# Patient Record
Sex: Male | Born: 1990 | Race: Black or African American | Hispanic: No | Marital: Single | Smoking: Former smoker
Health system: Southern US, Community
[De-identification: ages and names within clinical notes are randomized; demographics above are authoritative.]

## PROBLEM LIST (undated history)

## (undated) DIAGNOSIS — K37 Unspecified appendicitis: Secondary | ICD-10-CM

## (undated) DIAGNOSIS — L731 Pseudofolliculitis barbae: Secondary | ICD-10-CM

## (undated) HISTORY — PX: APPENDECTOMY: SHX54

## (undated) HISTORY — DX: Pseudofolliculitis barbae: L73.1

---
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
## 2011-01-01 ENCOUNTER — Telehealth: Payer: Self-pay | Admitting: *Deleted

## 2011-01-01 NOTE — Telephone Encounter (Signed)
Mother has dropped off a form for summer camp that needs to be completed.  Form is on your desk. They are asking for copy of immunizations, I didn't see anything in epic or centricity regarding immunizations.

## 2011-01-02 NOTE — Telephone Encounter (Signed)
Awaiting Imm records from Mom.

## 2011-01-17 NOTE — Telephone Encounter (Signed)
I did not complete forms, we have no vaccine record -- I tried to communicate that the best way that we could. 3 calls were made to family.  Mother picked up incomplete forms -- Zachary Simpson is healthy in general.

## 2011-01-17 NOTE — Telephone Encounter (Signed)
Spoke with mother and she picked up uncompleted form but, patient has already left for camp. Patient picked these up last week

## 2013-01-06 ENCOUNTER — Encounter: Payer: Self-pay | Admitting: Family Medicine

## 2013-01-06 ENCOUNTER — Ambulatory Visit (INDEPENDENT_AMBULATORY_CARE_PROVIDER_SITE_OTHER): Payer: BC Managed Care – PPO | Admitting: Family Medicine

## 2013-01-06 VITALS — BP 110/60 | HR 56 | Temp 98.4°F | Ht 67.0 in | Wt 162.8 lb

## 2013-01-06 DIAGNOSIS — Z0000 Encounter for general adult medical examination without abnormal findings: Secondary | ICD-10-CM

## 2013-01-06 DIAGNOSIS — Z131 Encounter for screening for diabetes mellitus: Secondary | ICD-10-CM

## 2013-01-06 DIAGNOSIS — Z13220 Encounter for screening for lipoid disorders: Secondary | ICD-10-CM

## 2013-01-06 NOTE — Progress Notes (Signed)
Nature conservation officer at [facility] [address] [place] [address] Phone: [phone] Fax: [phone]  Date:  01/06/2013   Name:  Zachary Simpson   DOB:  1990-08-26   MRN:  [id number] Gender: male Age: 22 y.o.  Primary Physician:  Zachary Beat, MD  Evaluating MD: Zachary Beat, MD   Chief Complaint: Annual Exam   History of Present Illness:  Zachary Simpson is a 22 y.o. pleasant patient who presents with the following:  CPX:  Went to [facility] at [employer], then [place]. Wants to do online courses at [employer].   Preventative Health Maintenance Visit:  Health Maintenance Summary Reviewed and updated, unless pt declines services.  Tobacco History Reviewed. Alcohol: No concerns, no excessive use Exercise Habits: Some activity, rec at least 30 mins 5 times a week- weights and basketball STD concerns: no risk or activity to increase risk Drug Use: None Encouraged self-testicular check     No past medical history on file.  No past surgical history on file.  History   Social History  . Marital Status: Single    Spouse Name: N/A    Number of Children: N/A  . Years of Education: N/A   Occupational History  . other     [employer]   Social History Main Topics  . Smoking status: Former Games developer  . Smokeless tobacco: Never Used  . Alcohol Use: No  . Drug Use: No  . Sexually Active: No   Other Topics Concern  . Not on file   Social History Narrative   Went to [employer] at [employer], then [place]. Wants to do online courses at [employer].    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  No Known Allergies  No current outpatient prescriptions on file prior to visit.   No current facility-administered medications on file prior to visit.     Review of Systems:   General: Denies fever, chills, sweats. No significant weight loss. Eyes: Denies blurring,significant itching ENT: Denies earache, sore throat, and  hoarseness. Cardiovascular: Denies chest pains, palpitations, dyspnea on exertion Respiratory: Denies cough, dyspnea at rest,wheeezing Breast: no concerns about lumps GI: Denies nausea, vomiting, diarrhea, constipation, change in bowel habits, abdominal pain, melena, hematochezia GU: Denies penile discharge, ED, urinary flow / outflow problems. No STD concerns. Musculoskeletal: Denies back pain, joint pain Derm: Denies rash, itching Neuro: Denies  paresthesias, frequent falls, frequent headaches Psych: Denies depression, anxiety Endocrine: Denies cold intolerance, heat intolerance, polydipsia Heme: Denies enlarged lymph nodes Allergy: No hayfever  Physical Examination: BP 110/60  Pulse 56  Temp(Src) 98.4 F (36.9 C) (Oral)  Ht 5\' 7"  (1.702 m)  Wt 162 lb 12 oz (73.823 kg)  BMI 25.48 kg/m2  SpO2 98%  Ideal Body Weight: Weight in (lb) to have BMI = 25: 159.3   Wt Readings from Last 3 Encounters:  01/06/13 162 lb 12 oz (73.823 kg)  03/02/10 144 lb (65.318 kg) (46%*, Z = -0.09)  02/14/10 145 lb 8 oz (65.998 kg) (49%*, Z = -0.01)   * Growth percentiles are based on CDC 2-20 Years data.    GEN: well developed, well nourished, no acute distress Eyes: conjunctiva and lids normal, PERRLA, EOMI ENT: TM clear, nares clear, oral exam WNL Neck: supple, no lymphadenopathy, no thyromegaly, no JVD Pulm: clear to auscultation and percussion, respiratory effort normal CV: regular rate and rhythm, S1-S2, no murmur, rub or gallop, no  bruits, peripheral pulses normal and symmetric, no cyanosis, clubbing, edema or varicosities Chest: no scars, masses GI: soft, non-tender; no hepatosplenomegaly, masses; active bowel sounds all quadrants GU: no hernia, testicular mass, penile discharge, or prostate enlargement Lymph: no cervical, axillary or inguinal adenopathy MSK: gait normal, muscle tone and strength WNL, no joint swelling, effusions, discoloration, crepitus  SKIN: clear, good turgor, color  WNL, no rashes, lesions, or ulcerations Neuro: normal mental status, normal strength, sensation, and motion Psych: alert; oriented to person, place and time, normally interactive and not anxious or depressed in appearance.  Assessment and Plan: Health Maintenance Exam:  The patient's preventative maintenance and recommended screening tests for an annual wellness exam were reviewed in full today. Brought up to date unless services declined.  Counselled on the importance of diet, exercise, and its role in overall health and mortality. The patient's FH and SH was reviewed, including their home life, tobacco status, and drug and alcohol status.   Doing great. No concerns.  Check FLP, BMP.  Signed, Zachary Galea. Bellamy Judson, MD 01/06/2013 3:00 PM

## 2013-01-07 LAB — LIPID PANEL
Cholesterol: 136 mg/dL (ref 0–200)
LDL Cholesterol: 75 mg/dL (ref 0–99)
Triglycerides: 54 mg/dL (ref 0.0–149.0)

## 2013-01-07 LAB — BASIC METABOLIC PANEL
BUN: 11 mg/dL (ref 6–23)
Calcium: 9.3 mg/dL (ref 8.4–10.5)
Chloride: 107 mEq/L (ref 96–112)
Creatinine, Ser: 1.1 mg/dL (ref 0.4–1.5)
GFR: 105.92 mL/min (ref >60.00)

## 2013-01-08 ENCOUNTER — Encounter: Payer: Self-pay | Admitting: *Deleted

## 2016-08-05 ENCOUNTER — Emergency Department: Payer: Managed Care, Other (non HMO)

## 2016-08-05 ENCOUNTER — Inpatient Hospital Stay
Admission: EM | Admit: 2016-08-05 | Discharge: 2016-08-08 | DRG: 373 | Disposition: A | Discharge status: Home / Self Care | Payer: Managed Care, Other (non HMO) | Attending: General Surgery | Admitting: General Surgery

## 2016-08-05 DIAGNOSIS — K3580 Unspecified acute appendicitis: Secondary | ICD-10-CM

## 2016-08-05 DIAGNOSIS — K3532 Acute appendicitis with perforation and localized peritonitis, without abscess: Secondary | ICD-10-CM | POA: Diagnosis present

## 2016-08-05 DIAGNOSIS — K353 Acute appendicitis with localized peritonitis: Principal | ICD-10-CM | POA: Diagnosis present

## 2016-08-05 DIAGNOSIS — Z87891 Personal history of nicotine dependence: Secondary | ICD-10-CM

## 2016-08-05 DIAGNOSIS — R1031 Right lower quadrant pain: Secondary | ICD-10-CM | POA: Diagnosis not present

## 2016-08-05 HISTORY — DX: Unspecified appendicitis: K37

## 2016-08-05 LAB — COMPREHENSIVE METABOLIC PANEL
ALBUMIN: 4.3 g/dL (ref 3.5–5.0)
ALK PHOS: 82 U/L (ref 38–126)
ALT: 19 U/L (ref 17–63)
AST: 21 U/L (ref 15–41)
Anion gap: 8 (ref 5–15)
BILIRUBIN TOTAL: 0.8 mg/dL (ref 0.3–1.2)
BUN: 11 mg/dL (ref 6–20)
CO2: 29 mmol/L (ref 22–32)
Calcium: 9.2 mg/dL (ref 8.9–10.3)
Chloride: 99 mmol/L — ABNORMAL LOW (ref 101–111)
Creatinine, Ser: 1.18 mg/dL (ref 0.61–1.24)
GFR calc Af Amer: >60 mL/min (ref >60)
GFR calc non Af Amer: >60 mL/min (ref >60)
Glucose, Bld: 102 mg/dL — ABNORMAL HIGH (ref 65–99)
POTASSIUM: 4.2 mmol/L (ref 3.5–5.1)
SODIUM: 136 mmol/L (ref 135–145)
TOTAL PROTEIN: 8.2 g/dL — AB (ref 6.5–8.1)

## 2016-08-05 LAB — URINALYSIS, COMPLETE (UACMP) WITH MICROSCOPIC
BACTERIA UA: NONE SEEN
Bilirubin Urine: NEGATIVE
Glucose, UA: NEGATIVE mg/dL
HGB URINE DIPSTICK: NEGATIVE
Ketones, ur: NEGATIVE mg/dL
Leukocytes, UA: NEGATIVE
NITRITE: NEGATIVE
PROTEIN: NEGATIVE mg/dL
RBC / HPF: NONE SEEN RBC/hpf (ref 0–5)
SPECIFIC GRAVITY, URINE: 1.014 (ref 1.005–1.030)
SQUAMOUS EPITHELIAL / LPF: NONE SEEN
pH: 7 (ref 5.0–8.0)

## 2016-08-05 LAB — CBC
HEMATOCRIT: 45.4 % (ref 40.0–52.0)
HEMOGLOBIN: 15.3 g/dL (ref 13.0–18.0)
MCH: 26.6 pg (ref 26.0–34.0)
MCHC: 33.7 g/dL (ref 32.0–36.0)
MCV: 78.9 fL — ABNORMAL LOW (ref 80.0–100.0)
Platelets: 198 10*3/uL (ref 150–440)
RBC: 5.75 MIL/uL (ref 4.40–5.90)
RDW: 13.6 % (ref 11.5–14.5)
WBC: 12.6 10*3/uL — ABNORMAL HIGH (ref 3.8–10.6)

## 2016-08-05 LAB — LIPASE, BLOOD: Lipase: 12 U/L (ref 11–51)

## 2016-08-05 MED ORDER — SODIUM CHLORIDE 0.9 % IV SOLN
Freq: Once | INTRAVENOUS | Status: AC
Start: 2016-08-06 — End: 2016-08-06
  Administered 2016-08-06: 01:00:00 via INTRAVENOUS

## 2016-08-05 MED ORDER — IOPAMIDOL (ISOVUE-300) INJECTION 61%
100.0000 mL | Freq: Once | INTRAVENOUS | Status: AC | PRN
  Administered 2016-08-05: 100 mL via INTRAVENOUS

## 2016-08-05 MED ORDER — IOPAMIDOL (ISOVUE-300) INJECTION 61%
30.0000 mL | Freq: Once | INTRAVENOUS | Status: AC
  Administered 2016-08-05: 30 mL via ORAL

## 2016-08-05 MED ORDER — SODIUM CHLORIDE 0.9 % IV BOLUS (SEPSIS)
1000.0000 mL | Freq: Once | INTRAVENOUS | Status: AC
Start: 2016-08-05 — End: 2016-08-06
  Administered 2016-08-05: 1000 mL via INTRAVENOUS

## 2016-08-05 MED ORDER — PIPERACILLIN-TAZOBACTAM 3.375 G IVPB 30 MIN
3.3750 g | Freq: Once | INTRAVENOUS | Status: AC
  Administered 2016-08-06: 3.375 g via INTRAVENOUS
  Filled 2016-08-05: qty 50

## 2016-08-05 MED ORDER — MORPHINE SULFATE (PF) 4 MG/ML IV SOLN
4.0000 mg | Freq: Once | INTRAVENOUS | Status: AC
  Administered 2016-08-05: 4 mg via INTRAVENOUS
  Filled 2016-08-05: qty 1

## 2016-08-05 NOTE — ED Triage Notes (Signed)
PT c/o lower abdominal pain that began Saturday. Pt reports he was recently treated for appendicitis in December, with antibiotics.

## 2016-08-05 NOTE — ED Provider Notes (Signed)
[facility] Emergency Department Provider Note    ____________________________________________   I have reviewed the triage vital signs and the nursing notes.   HISTORY  Chief Complaint Abdominal Pain   History limited by: Not Limited   HPI Zachary Simpson is a 26 y.o. male who presents to the emergency department today because of concerns for abdominal pain. The patient states the pain started higher up in his stomach is now started migrating down towards his lower abdomen. Patient has had some accompanying nausea and vomiting. Is not had any diarrhea or bloody stool, in fact he has had some decreased bowel movements. Last bowel movement was 2 days ago. Patient states that 2 months ago he was treated for appendicitis. He states that time he had a ruptured appendicitis and he was treated with antibiotics. He has not noticed any fevers.   Past Medical History:  Diagnosis Date  . Appendicitis     Patient Active Problem List   Diagnosis Date Noted  . EXTERNAL HEMORRHOIDS 03/02/2010    History reviewed. No pertinent surgical history.  Prior to Admission medications   Not on File    Allergies Patient has no known allergies.  No family history on file.  Social History Social History  Substance Use Topics  . Smoking status: Former Games developer  . Smokeless tobacco: Never Used  . Alcohol use No    Review of Systems  Constitutional: Negative for fever. Cardiovascular: Negative for chest pain. Respiratory: Negative for shortness of breath. Gastrointestinal: Positive for abdominal pain. Genitourinary: Negative for dysuria. Musculoskeletal: Negative for back pain. Skin: Negative for rash. Neurological: Negative for headaches, focal weakness or numbness.  10-point ROS otherwise negative.  ____________________________________________   PHYSICAL EXAM:  VITAL SIGNS: ED Triage Vitals  Enc Vitals Group     BP 08/05/16 1837 (!) 154/91     Pulse  Rate 08/05/16 1837 76     Resp 08/05/16 1837 16     Temp 08/05/16 1837 98.4 F (36.9 C)     Temp Source 08/05/16 1837 Oral     SpO2 08/05/16 1837 100 %     Weight 08/05/16 1838 190 lb (86.2 kg)     Height 08/05/16 1838 5\' 7"  (1.702 m)     Head Circumference --      Peak Flow --      Pain Score 08/05/16 1838 8   Constitutional: Alert and oriented. Well appearing and in no distress. Eyes: Conjunctivae are normal. Normal extraocular movements. ENT   Head: Normocephalic and atraumatic.   Nose: No congestion/rhinnorhea.   Mouth/Throat: Mucous membranes are moist.   Neck: No stridor. Hematological/Lymphatic/Immunilogical: No cervical lymphadenopathy. Cardiovascular: Normal rate, regular rhythm.  No murmurs, rubs, or gallops.  Respiratory: Normal respiratory effort without tachypnea nor retractions. Breath sounds are clear and equal bilaterally. No wheezes/rales/rhonchi. Gastrointestinal: Soft. Tender to palpation in the right lower quadrant.  Genitourinary: Deferred Musculoskeletal: Normal range of motion in all extremities. No lower extremity edema. Neurologic:  Normal speech and language. No gross focal neurologic deficits are appreciated.  Skin:  Skin is warm, dry and intact. No rash noted. Psychiatric: Mood and affect are normal. Speech and behavior are normal. Patient exhibits appropriate insight and judgment.  ____________________________________________    LABS (pertinent positives/negatives)  Labs Reviewed  COMPREHENSIVE METABOLIC PANEL - Abnormal; Notable for the following:       Result Value   Chloride 99 (*)    Glucose, Bld 102 (*)    Total Protein 8.2 (*)  All other components within normal limits  CBC - Abnormal; Notable for the following:    WBC 12.6 (*)    MCV 78.9 (*)    All other components within normal limits  URINALYSIS, COMPLETE (UACMP) WITH MICROSCOPIC - Abnormal; Notable for the following:    Color, Urine YELLOW (*)    APPearance CLEAR (*)     All other components within normal limits  LIPASE, BLOOD     ____________________________________________   EKG  None  ____________________________________________    RADIOLOGY  CT abd/pel pending  ___________________________________________   PROCEDURES  Procedures  ____________________________________________   INITIAL IMPRESSION / ASSESSMENT AND PLAN / ED COURSE  Pertinent labs & imaging results that were available during my care of the patient were reviewed by me and considered in my medical decision making (see chart for details).  Patient presented to the emergency department today because of concerns for abdominal pain. On exam patient is tender in the right lower quadrant. Patient in mild leukocytosis. Patient recently was treated with antibiotics for appendicitis. At this point I do have concern for recurrent appendicitis. Will plan on obtaining CT abdomen and pelvis.  ____________________________________________   FINAL CLINICAL IMPRESSION(S) / ED DIAGNOSES  Abdominal pain  Note: This dictation was prepared with Dragon dictation. Any transcriptional errors that result from this process are unintentional     Phineas SemenGraydon Roshini Fulwider, MD 08/05/16 2246

## 2016-08-05 NOTE — ED Notes (Signed)
Patient transported to CT via stretcher.

## 2016-08-06 DIAGNOSIS — K352 Acute appendicitis with generalized peritonitis: Secondary | ICD-10-CM | POA: Diagnosis not present

## 2016-08-06 DIAGNOSIS — K3532 Acute appendicitis with perforation and localized peritonitis, without abscess: Secondary | ICD-10-CM | POA: Diagnosis present

## 2016-08-06 DIAGNOSIS — Z87891 Personal history of nicotine dependence: Secondary | ICD-10-CM | POA: Diagnosis not present

## 2016-08-06 DIAGNOSIS — K353 Acute appendicitis with localized peritonitis: Secondary | ICD-10-CM | POA: Diagnosis present

## 2016-08-06 DIAGNOSIS — R1031 Right lower quadrant pain: Secondary | ICD-10-CM | POA: Diagnosis present

## 2016-08-06 LAB — CBC
HCT: 42.2 % (ref 40.0–52.0)
HEMOGLOBIN: 14.2 g/dL (ref 13.0–18.0)
MCH: 26.7 pg (ref 26.0–34.0)
MCHC: 33.8 g/dL (ref 32.0–36.0)
MCV: 79.1 fL — ABNORMAL LOW (ref 80.0–100.0)
PLATELETS: 192 10*3/uL (ref 150–440)
RBC: 5.34 MIL/uL (ref 4.40–5.90)
RDW: 13.7 % (ref 11.5–14.5)
WBC: 10 10*3/uL (ref 3.8–10.6)

## 2016-08-06 LAB — BASIC METABOLIC PANEL
ANION GAP: 4 — AB (ref 5–15)
BUN: 10 mg/dL (ref 6–20)
CHLORIDE: 106 mmol/L (ref 101–111)
CO2: 30 mmol/L (ref 22–32)
Calcium: 8.9 mg/dL (ref 8.9–10.3)
Creatinine, Ser: 1.16 mg/dL (ref 0.61–1.24)
Glucose, Bld: 96 mg/dL (ref 65–99)
POTASSIUM: 3.8 mmol/L (ref 3.5–5.1)
SODIUM: 140 mmol/L (ref 135–145)

## 2016-08-06 LAB — SURGICAL PCR SCREEN
MRSA, PCR: NEGATIVE
STAPHYLOCOCCUS AUREUS: NEGATIVE

## 2016-08-06 LAB — MAGNESIUM: MAGNESIUM: 1.8 mg/dL (ref 1.7–2.4)

## 2016-08-06 LAB — PHOSPHORUS: PHOSPHORUS: 3.8 mg/dL (ref 2.5–4.6)

## 2016-08-06 MED ORDER — HYDROMORPHONE HCL 1 MG/ML IJ SOLN
0.5000 mg | INTRAMUSCULAR | Status: DC | PRN
  Administered 2016-08-06: 0.5 mg via INTRAVENOUS
  Filled 2016-08-06 (×2): qty 0.5

## 2016-08-06 MED ORDER — HYDRALAZINE HCL 20 MG/ML IJ SOLN: 10.0000 mg | INTRAMUSCULAR | Status: DC | PRN

## 2016-08-06 MED ORDER — ONDANSETRON HCL 4 MG/2ML IJ SOLN
4.0000 mg | Freq: Four times a day (QID) | INTRAMUSCULAR | Status: DC | PRN
  Administered 2016-08-07: 4 mg via INTRAVENOUS
  Filled 2016-08-06 (×2): qty 2

## 2016-08-06 MED ORDER — DIPHENHYDRAMINE HCL 50 MG/ML IJ SOLN: 25.0000 mg | Freq: Four times a day (QID) | INTRAMUSCULAR | Status: DC | PRN

## 2016-08-06 MED ORDER — ENOXAPARIN SODIUM 40 MG/0.4ML ~~LOC~~ SOLN
40.0000 mg | SUBCUTANEOUS | Status: DC
  Administered 2016-08-06 – 2016-08-08 (×3): 40 mg via SUBCUTANEOUS
  Filled 2016-08-06 (×3): qty 0.4

## 2016-08-06 MED ORDER — SODIUM CHLORIDE 0.9 % IV SOLN
INTRAVENOUS | Status: DC
  Administered 2016-08-06 – 2016-08-07 (×5): via INTRAVENOUS

## 2016-08-06 MED ORDER — ONDANSETRON 4 MG PO TBDP: 4.0000 mg | ORAL_TABLET | Freq: Four times a day (QID) | ORAL | Status: DC | PRN

## 2016-08-06 MED ORDER — KETOROLAC TROMETHAMINE 30 MG/ML IJ SOLN
30.0000 mg | Freq: Four times a day (QID) | INTRAMUSCULAR | Status: DC
  Administered 2016-08-06 – 2016-08-08 (×7): 30 mg via INTRAVENOUS
  Filled 2016-08-06 (×7): qty 1

## 2016-08-06 MED ORDER — PANTOPRAZOLE SODIUM 40 MG IV SOLR
40.0000 mg | Freq: Every day | INTRAVENOUS | Status: DC
  Administered 2016-08-06 – 2016-08-07 (×3): 40 mg via INTRAVENOUS
  Filled 2016-08-06 (×3): qty 40

## 2016-08-06 MED ORDER — MORPHINE SULFATE (PF) 4 MG/ML IV SOLN
4.0000 mg | INTRAVENOUS | Status: DC | PRN
  Administered 2016-08-06 (×4): 4 mg via INTRAVENOUS
  Filled 2016-08-06 (×4): qty 1

## 2016-08-06 MED ORDER — DIPHENHYDRAMINE HCL 25 MG PO CAPS: 25.0000 mg | ORAL_CAPSULE | Freq: Four times a day (QID) | ORAL | Status: DC | PRN

## 2016-08-06 MED ORDER — PIPERACILLIN-TAZOBACTAM 3.375 G IVPB
3.3750 g | Freq: Three times a day (TID) | INTRAVENOUS | Status: DC
  Administered 2016-08-06 – 2016-08-07 (×4): 3.375 g via INTRAVENOUS
  Filled 2016-08-06 (×4): qty 50

## 2016-08-06 NOTE — ED Notes (Signed)
Surgeon at bedside.  

## 2016-08-06 NOTE — H&P (Signed)
Patient ID: Zachary Simpson, male   DOB: 10-20-1990, 26 y.o.   MRN: [id number]  CC: ABDOMINAL PAIN  HPI Zachary Simpson is a 26 y.o. male who presents to the ER for evaluation of abdominal pain. Patient reports that approximately 2-1/2 months ago he was [facility] for ruptured appendicitis with antibiotics alone. He reports that he recovered well from that and was at home visiting family when he had a recurrence of his abdominal pain. The pain started in the midepigastrium and then gradually moved back to the right lower quadrant like his prior appendicitis. Initially thought it was related to constipation and took multiple doses of MiraLAX and Colace over the weekend without any results. He uses suppository and had a bowel movement without any relief of his pain. He's had nausea but no vomiting. He denies any fevers or chills. He denies any chest pain, shortness of breath, anorexia. He was eating all day today and last ate immediately before coming to the emergency department. 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 He is due to report on the third. He states that an interval appendectomy was being arranged for had not been scheduled yet.  HPI  Past Medical History:  Diagnosis Date  . Appendicitis     History reviewed. No pertinent surgical history. Patient has had no prior surgeries.  Family history: Denies any known family history of cancer, diabetes, heart disease.  Social History Social History  Substance Use Topics  . Smoking status: Former Games developermoker  . Smokeless tobacco: Never Used  . Alcohol use No    No Known Allergies  Current Facility-Administered Medications  Medication Dose Route Frequency Provider Last Rate Last Dose  . 0.9 %  sodium chloride infusion   Intravenous Once Phineas SemenGraydon Goodman, MD       No current outpatient prescriptions on file.     Review of Systems A Multi-point review of systems was asked and was  negative except for the findings documented in the history of present illness  Physical Exam Blood pressure (!) 143/97, pulse 65, temperature 98.4 F (36.9 C), temperature source Oral, resp. rate 16, height 5\' 7"  (1.702 m), weight 86.2 kg (190 lb), SpO2 99 %. CONSTITUTIONAL: No acute distress. EYES: Pupils are equal, round, and reactive to light, Sclera are non-icteric. EARS, NOSE, MOUTH AND THROAT: The oropharynx is clear. The oral mucosa is pink and moist. Hearing is intact to voice. LYMPH NODES:  Lymph nodes in the neck are normal. RESPIRATORY:  Lungs are clear. There is normal respiratory effort, with equal breath sounds bilaterally, and without pathologic use of accessory muscles. CARDIOVASCULAR: Heart is regular without murmurs, gallops, or rubs. GI: The abdomen is soft, tender to palpation in the right lower quadrant at McBurney's point but with a negative Rovsing or psoas sign., and nondistended. There are no palpable masses. There is no hepatosplenomegaly. There are normal bowel sounds in all quadrants. GU: Rectal deferred.   MUSCULOSKELETAL: Normal muscle strength and tone. No cyanosis or edema.   SKIN: Turgor is good and there are no pathologic skin lesions or ulcers. NEUROLOGIC: Motor and sensation is grossly normal. Cranial nerves are grossly intact. PSYCH:  Oriented to person, place and time. Affect is normal.  Data Reviewed Images and labs reviewed. Labs show mild leukocytosis of 12.6 but are otherwise within normal limits. CT scan of the abdomen does show an abscess around his appendix in the right lower quadrant with a visualized fecalith.  There is no free air within the abdomen. I have personally reviewed the patient's imaging, laboratory findings and medical records.    Assessment    Ruptured appendicitis with abscess    Plan    26 year old male with a ruptured appendicitis with abscess. This appears to be a short interval recurrence from his recent ruptured  appendicitis in [facility] Discussed the diagnosis at length with the patient. Plan will be for admission, IV fluids, IV antibiotics. Possible attempted drainage of the abscess tomorrow versus treatment with just antibiotics. Patient states he wants to try treating with antibiotics if possible. 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 He is an active duty reservist. Plan for admission, nothing by mouth, IV fluids, IV antibiotics. Possible IR drainage versus operation should he fail to improve on IV antibiotics. All questions answered to the patient's satisfaction and he agrees with this plan.     Time spent with the patient was 50 minutes, with more than 50% of the time spent in face-to-face education, counseling and care coordination.     Ricarda Frame, MD FACS General Surgeon 08/06/2016, 12:48 AM

## 2016-08-06 NOTE — ED Notes (Signed)
Pt transported to room 207 

## 2016-08-06 NOTE — Clinical Social Work Note (Signed)
CSW consulted by nursing due to patient's mother having questions about what patient will need to provide to his superior officers to let them know he is in the hospital. CSW advised nursing to inform patient that he will need to contact his superior officers to notify he is in the hospital and to find out what information he needs to provide them. York SpanielMonica Lexington Krotz MSW,LCSW [phone]

## 2016-08-06 NOTE — Progress Notes (Signed)
08/06/2016  Subjective: No acute events overnight. Patient was admitted with a [facility] of [facility] and came in overnight with worsening abdominal pain. He had a mild leukocytosis of 12.6 which has improved this morning to 10 with IV antibiotics. Reports that his pain is slightly better and denies any nausea or vomiting.  Vital signs: Temp:  [97.8 F (36.6 C)-98.4 F (36.9 C)] 97.8 F (36.6 C) (01/02 0808) Pulse Rate:  [58-104] 58 (01/02 0808) Resp:  [16-19] 16 (01/02 0808) BP: (125-154)/(81-97) 130/82 (01/02 0808) SpO2:  [96 %-100 %] 100 % (01/02 0808) Weight:  [86.2 kg (190 lb)] 86.2 kg (190 lb) (01/01 1838)   Intake/Output: 01/01 0701 - 01/02 0700 In: 523.3 [I.V.:441; IV Piggyback:82.3] Out: 425 [Urine:425]    Physical Exam: Constitutional: No acute distress Abdomen: Soft, nondistended, with mild tenderness to palpation in the right lower quadrant. No peritoneal signs.  Labs:   Recent Labs  08/05/16 1839 08/06/16 0511  WBC 12.6* 10.0  HGB 15.3 14.2  HCT 45.4 42.2  PLT 198 192    Recent Labs  08/05/16 1839 08/06/16 0511  NA 136 140  K 4.2 3.8  CL 99* 106  CO2 29 30  GLUCOSE 102* 96  BUN 11 10  CREATININE 1.18 1.16  CALCIUM 9.2 8.9   No results for input(s): LABPROT, INR in the last 72 hours.  Imaging: Ct Abdomen Pelvis W Contrast  Result Date: 08/05/2016 CLINICAL DATA:  Upper abdominal pain now radiating to lower abdomen. No bowel movement for 2 days. Ruptured appendicitis 2 months ago. No fever. EXAM: CT ABDOMEN AND PELVIS WITH CONTRAST TECHNIQUE: Multidetector CT imaging of the abdomen and pelvis was performed using the standard protocol following bolus administration of intravenous contrast. CONTRAST:  100mL ISOVUE-300 IOPAMIDOL (ISOVUE-300) INJECTION 61% COMPARISON:  None. FINDINGS: LOWER CHEST: Lung bases are clear. Included heart size is normal. No pericardial effusion. HEPATOBILIARY: Liver and gallbladder are  normal. PANCREAS: Normal. SPLEEN: Normal. ADRENALS/URINARY TRACT: Kidneys are orthotopic, demonstrating symmetric enhancement. No nephrolithiasis, hydronephrosis or solid renal masses. The unopacified ureters are normal in course and caliber. Urinary bladder is partially distended and unremarkable. Normal adrenal glands. STOMACH/BOWEL: The stomach, small and large bowel are normal in course and caliber without inflammatory changes. Appendix is 13 mm in transaxial dimension with hyperemic wall thickening and, 4 mm appendicolith. Surrounding 3.2 x 2.2 cm fluid collection with rim enhancement and fat stranding. Moderate amount of retained large bowel stool. VASCULAR/LYMPHATIC: Aortoiliac vessels are normal in course and caliber. No lymphadenopathy by CT size criteria. REPRODUCTIVE: Normal. OTHER: Small amount of free fluid in the pelvis. No intraperitoneal free air. MUSCULOSKELETAL: Nonacute. IMPRESSION: Perforated appendicitis, 3.2 x 2.2 cm para appendiceal abscess. Moderate amount of retained large bowel stool. No bowel obstruction. Electronically Signed   By: Awilda Metroourtnay  Bloomer M.D.   On: 08/05/2016 23:49    Assessment/Plan: 26 year old male with rupture appendicitis with abscess from [facility] [place] now with worsening abdominal pain and leukocytosis which has improved now. Next  -We'll continue IV antibiotics today and add ice chips and sips of water to his diet. His pain improves he may be advanced to clear liquids later today or likely tomorrow. Maryclare Labrador-We'll get in touch with his basein [place] as he needs to report there tomorrow.   Howie IllJose Luis Maisie Hauser, MD [facility]

## 2016-08-06 NOTE — Care Management Note (Signed)
Case Management Note  Patient Details  Name: Zachary Simpson MRN: [id number] Date of Birth: 12/14/1990  Subjective/Objective:        Patient has active VA benefits per night secretary, and paperwork was faxed to them.Admitted here because [facility] had trouble determining benefits per staff.            Action/Plan:   Expected Discharge Date:  08/08/16               Expected Discharge Plan:     In-House Referral:     Discharge planning Services     Post Acute Care Choice:    Choice offered to:     DME Arranged:    DME Agency:     HH Arranged:    HH Agency:     Status of Service:     If discussed at [employer] Meetings, dates discussed:    Additional Comments:  Berna BueCheryl Lachlan Mckim, RN 08/06/2016, 7:48 AM

## 2016-08-07 LAB — CBC WITH DIFFERENTIAL/PLATELET
Basophils Absolute: 0 10*3/uL (ref 0–0.1)
Basophils Relative: 0 %
EOS ABS: 0.2 10*3/uL (ref 0–0.7)
Eosinophils Relative: 3 %
HEMATOCRIT: 41.8 % (ref 40.0–52.0)
HEMOGLOBIN: 14.3 g/dL (ref 13.0–18.0)
LYMPHS ABS: 1.5 10*3/uL (ref 1.0–3.6)
Lymphocytes Relative: 22 %
MCH: 27 pg (ref 26.0–34.0)
MCHC: 34.2 g/dL (ref 32.0–36.0)
MCV: 78.8 fL — AB (ref 80.0–100.0)
MONO ABS: 0.6 10*3/uL (ref 0.2–1.0)
Monocytes Relative: 9 %
NEUTROS PCT: 66 %
Neutro Abs: 4.7 10*3/uL (ref 1.4–6.5)
PLATELETS: 187 10*3/uL (ref 150–440)
RBC: 5.31 MIL/uL (ref 4.40–5.90)
RDW: 13.3 % (ref 11.5–14.5)
WBC: 7 10*3/uL (ref 3.8–10.6)

## 2016-08-07 LAB — BASIC METABOLIC PANEL
ANION GAP: 7 (ref 5–15)
BUN: 13 mg/dL (ref 6–20)
CALCIUM: 8.4 mg/dL — AB (ref 8.9–10.3)
CO2: 26 mmol/L (ref 22–32)
Chloride: 108 mmol/L (ref 101–111)
Creatinine, Ser: 1.3 mg/dL — ABNORMAL HIGH (ref 0.61–1.24)
GLUCOSE: 75 mg/dL (ref 65–99)
POTASSIUM: 4 mmol/L (ref 3.5–5.1)
Sodium: 141 mmol/L (ref 135–145)

## 2016-08-07 MED ORDER — AMOXICILLIN-POT CLAVULANATE 875-125 MG PO TABS
1.0000 | ORAL_TABLET | Freq: Two times a day (BID) | ORAL | Status: DC
  Administered 2016-08-07 – 2016-08-08 (×3): 1 via ORAL
  Filled 2016-08-07 (×3): qty 1

## 2016-08-07 MED ORDER — POLYETHYLENE GLYCOL 3350 17 G PO PACK
17.0000 g | PACK | Freq: Every day | ORAL | Status: DC
  Administered 2016-08-07 – 2016-08-08 (×2): 17 g via ORAL
  Filled 2016-08-07 (×2): qty 1

## 2016-08-07 NOTE — Care Management (Signed)
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  Patient was visiting family for the holidays.  I have spoke [place].  There is no documentation that they need.  [facility]. Per Jacki ConesLaurie the patient needs to speak directly with his superior to determine if there any paper work that they need faxed.  Should the patient need to be transferred to another facility, patient would need to transfer to a facility local to his base.  Patient should follow up with MD at his base after discharge.  Anticipate discharge tomorrow.  RNCM following

## 2016-08-07 NOTE — Progress Notes (Signed)
08/07/2016  Subjective: No acute events overnight. Patient reports this morning that his pain has improved. Denies any vomiting or nausea. No fevers or chills.  Vital signs: Temp:  [97.3 F (36.3 C)-98.3 F (36.8 C)] 97.5 F (36.4 C) (01/03 1017) Pulse Rate:  [58-72] 65 (01/03 1017) Resp:  [14-16] 14 (01/03 1017) BP: (110-128)/(49-74) 128/74 (01/03 1017) SpO2:  [97 %-100 %] 100 % (01/03 1017)   Intake/Output: 01/02 0701 - 01/03 0700 In: 2991.5 [I.V.:2907.5; IV Piggyback:84] Out: 2500 [Urine:2500] Last BM Date: 08/03/16  Physical Exam: Constitutional: No acute distress Abdomen:  Soft, nondistended, with minimal to none tenderness to palpation anymore.  Labs:   Recent Labs  08/06/16 0511 08/07/16 0430  WBC 10.0 7.0  HGB 14.2 14.3  HCT 42.2 41.8  PLT 192 187    Recent Labs  08/06/16 0511 08/07/16 0430  NA 140 141  K 3.8 4.0  CL 106 108  CO2 30 26  GLUCOSE 96 75  BUN 10 13  CREATININE 1.16 1.30*  CALCIUM 8.9 8.4*   No results for input(s): LABPROT, INR in the last 72 hours.  Imaging: No results found.  Assessment/Plan: 26 year old male with ruptured appendicitis with abscess from [facility] [place] now with worsening abdominal pain and leukocytosis which has improved now.  -White blood cell count is normalized at 7.0. Patient denies any fevers with much improved abdominal pain. We'll start the patient a clear liquid diet today. -Transition IV antibiotics to oral version. -Likely discharge tomorrow.   Howie IllJose Luis Lauris Serviss, MD [facility]

## 2016-08-08 LAB — CBC WITH DIFFERENTIAL/PLATELET
Basophils Absolute: 0 10*3/uL (ref 0–0.1)
Basophils Relative: 0 %
Eosinophils Absolute: 0.2 10*3/uL (ref 0–0.7)
Eosinophils Relative: 4 %
HEMATOCRIT: 41.2 % (ref 40.0–52.0)
Hemoglobin: 13.8 g/dL (ref 13.0–18.0)
LYMPHS ABS: 2.1 10*3/uL (ref 1.0–3.6)
Lymphocytes Relative: 36 %
MCH: 26.8 pg (ref 26.0–34.0)
MCHC: 33.5 g/dL (ref 32.0–36.0)
MCV: 80.1 fL (ref 80.0–100.0)
MONO ABS: 0.6 10*3/uL (ref 0.2–1.0)
Monocytes Relative: 10 %
NEUTROS ABS: 2.9 10*3/uL (ref 1.4–6.5)
Neutrophils Relative %: 50 %
PLATELETS: 198 10*3/uL (ref 150–440)
RBC: 5.14 MIL/uL (ref 4.40–5.90)
RDW: 13.4 % (ref 11.5–14.5)
WBC: 5.8 10*3/uL (ref 3.8–10.6)

## 2016-08-08 MED ORDER — AMOXICILLIN-POT CLAVULANATE 875-125 MG PO TABS: 1.0000 | ORAL_TABLET | Freq: Two times a day (BID) | ORAL | 0 refills | Status: AC

## 2016-08-08 MED ORDER — IBUPROFEN 600 MG PO TABS: 600.0000 mg | ORAL_TABLET | Freq: Three times a day (TID) | ORAL | 0 refills | Status: DC | PRN

## 2016-08-08 NOTE — Discharge Summary (Signed)
Patient ID: Zachary Simpson MRN: [id number] DOB/AGE: [birth year] 26 y.o.  Admit date: 08/05/2016 Discharge date: 08/08/2016   Discharge Diagnoses:  Active Problems:   Ruptured appendicitis   Procedures: None  Hospital Course: Patient was admitted on 1/1 with recurrent appendicitis. Patient had a prior episode of rupture [place] He was treated with antibiotics and was on his way over to [place] for his next station when he developed abdominal pain. Here there was a very small abscess on CT scan which was uncertain whether this was from prior episode or new. He was treated with IV antibiotics and his white blood cell count normalized. She was transition to oral antibiotics and his diet was slowly advanced. Prior to discharge she was tolerating a regular diet, had no fevers, his white blood cell count was normal, was passing flatus and voiding without issues. He was deemed ready for discharge.  Consults:  None  Disposition: Home, self-care  Discharge Instructions    Call MD for:  difficulty breathing, headache or visual disturbances    Complete by:  As directed    Call MD for:  persistant nausea and vomiting    Complete by:  As directed    Call MD for:  severe uncontrolled pain    Complete by:  As directed    Call MD for:  temperature >100.4    Complete by:  As directed    Diet - low sodium heart healthy    Complete by:  As directed    Discharge instructions    Complete by:  As directed    Continue antibiotics until completed course.   Driving Restrictions    Complete by:  As directed    Do not drive while taking narcotics for pain control   Increase activity slowly    Complete by:  As directed      Allergies as of 08/08/2016   No Known Allergies     Medication List    TAKE these medications   acetaminophen 325 MG tablet Commonly known as:  TYLENOL Take 650 mg by mouth every 6 (six) hours as needed.   amoxicillin-clavulanate  875-125 MG tablet Commonly known as:  AUGMENTIN Take 1 tablet by mouth every 12 (twelve) hours.   docusate sodium 100 MG capsule Commonly known as:  COLACE Take 100 mg by mouth 2 (two) times daily.   ibuprofen 600 MG tablet Commonly known as:  ADVIL,MOTRIN Take 1 tablet (600 mg total) by mouth every 8 (eight) hours as needed for fever, mild pain or moderate pain.   polyethylene glycol packet Commonly known as:  MIRALAX / GLYCOLAX Take 17 g by mouth daily.   ROXICODONE 5 MG immediate release tablet Generic drug:  oxyCODONE Take 5 mg by mouth every 4 (four) hours as needed for severe pain.      Follow-up Information    [facility] [place] Follow up.   Why:  Please follow up with your surgeon in [place] for interval appendectomy.

## 2016-08-08 NOTE — Progress Notes (Signed)
08/08/2016  Subjective: No acute events overnight. Patient tolerated regular diet well with no complications. Has been passing flatus. Denies any nausea or vomiting or fevers. Denies any further abdominal pain.  Vital signs: Temp:  [97.5 F (36.4 C)-98.4 F (36.9 C)] 97.9 F (36.6 C) (01/04 0516) Pulse Rate:  [56-132] 56 (01/04 0516) Resp:  [14-22] 17 (01/04 0516) BP: (109-128)/(60-79) 113/79 (01/04 0516) SpO2:  [97 %-100 %] 97 % (01/04 0516)   Intake/Output: 01/03 0701 - 01/04 0700 In: 2731.6 [P.O.:750; I.V.:1981.6] Out: 1550 [Urine:1550] Last BM Date: 08/03/16  Physical Exam: Constitutional: No acute distress Abdomen:  Soft, nondistended, nontender to palpation  Labs:   Recent Labs  08/07/16 0430 08/08/16 0542  WBC 7.0 5.8  HGB 14.3 13.8  HCT 41.8 41.2  PLT 187 198    Recent Labs  08/06/16 0511 08/07/16 0430  NA 140 141  K 3.8 4.0  CL 106 108  CO2 30 26  GLUCOSE 96 75  BUN 10 13  CREATININE 1.16 1.30*  CALCIUM 8.9 8.4*   No results for input(s): LABPROT, INR in the last 72 hours.  Imaging: No results found.  Assessment/Plan: [age] year old male with a history of [facility] [facility] [place] treated with antibiotics with worsening abdominal pain and leukocytosis now resolved.  -We'll discharge the patient to home today. Patient will complete a course of oral antibiotics at home.   Howie IllJose Luis Denese Mentink, MD [facility]

## 2016-08-08 NOTE — Plan of Care (Signed)
Problem: Pain Managment: Goal: General experience of comfort will improve Outcome: Progressing Pt is reporting further decrease in pain.

## 2016-08-08 NOTE — Progress Notes (Signed)
Pt A and O x 4. VSS. Pt tolerating diet well. No complaints of pain or nausea. IV removed intact, prescriptions given. Pt voiced understanding of discharge instructions with no further questions. Pt refused wheelchair and ambulated downstairs independently.

## 2017-01-27 ENCOUNTER — Telehealth: Payer: Self-pay | Admitting: Family Medicine

## 2017-01-27 NOTE — Telephone Encounter (Signed)
Pt would like to continue having you as a provider. He states he has been in the [employer] for the last 4 years and they took care of his medical needs.  Pt is wanting to see you and get a letter for work about shaving ( he states that it breaks his face out)   Please let me know if I can schedule him for an appointment with you.  Thanks

## 2017-01-27 NOTE — Telephone Encounter (Signed)
This is ok, I am happy to see him

## 2017-02-06 ENCOUNTER — Ambulatory Visit: Payer: Managed Care, Other (non HMO) | Admitting: Family Medicine

## 2017-02-19 ENCOUNTER — Ambulatory Visit (INDEPENDENT_AMBULATORY_CARE_PROVIDER_SITE_OTHER): Payer: 59 | Admitting: Family Medicine

## 2017-02-19 ENCOUNTER — Encounter: Payer: Self-pay | Admitting: Family Medicine

## 2017-02-19 VITALS — BP 100/70 | HR 64 | Temp 98.5°F | Ht 66.75 in | Wt 203.8 lb

## 2017-02-19 DIAGNOSIS — L731 Pseudofolliculitis barbae: Secondary | ICD-10-CM

## 2017-02-19 NOTE — Progress Notes (Signed)
Dr. Karleen Hampshire T. Yuleni Burich, MD, CAQ Sports Medicine Primary Care and Sports Medicine [address] [place], [address] Phone: [phone] Fax: [phone]  02/19/2017  Patient: Zachary Simpson, MRN: [id number], DOB: [birth year], 26 y.o.  Primary Physician:  Hannah Beat, MD   Chief Complaint  Patient presents with  . Re-Establish Care   Subjective:   Zachary Simpson is a 26 y.o. 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  Single.  No children.   He has been doing well, and is been healthy with the exception of an appendicitis earlier in the year. He is fully recovered from this now.  He also has recurrent pseudofolliculitis barbae. He has trouble with this when he has to shave every day, and per [employer] regulation, he is supposed to do this daily, along with his workplace. He would like to have an accommodation.  Past Medical History, Surgical History, Social History, Family History, Problem List, Medications, and Allergies have been reviewed and updated if relevant.  Patient Active Problem List   Diagnosis Date Noted  . Ruptured appendicitis 08/06/2016  . EXTERNAL HEMORRHOIDS 03/02/2010    Past Medical History:  Diagnosis Date  . Appendicitis     Past Surgical History:  Procedure Laterality Date  . APPENDECTOMY      Social History   Social History  . Marital status: Single    Spouse name: N/A  . Number of children: N/A  . Years of education: N/A   Occupational History  . other     [employer]   Social History Main Topics  . Smoking status: Former Games developer  . Smokeless tobacco: Never Used  . Alcohol use No  . Drug use: No  . Sexual activity: No   Other Topics Concern  . Not on file   Social History Narrative   Went to [employer] at [employer], then [place]. Wants to do online courses at [employer].    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 Hypertension Mother   . Stroke Maternal Grandfather     No Known Allergies  Medication list reviewed and updated in full in [facility].   GEN: No acute illnesses, no fevers, chills. GI: No n/v/d, eating normally Pulm: No SOB Interactive and getting along well at home.  Otherwise, ROS is as per the HPI.  Objective:   BP 100/70   Pulse 64   Temp 98.5 F (36.9 C) (Oral)   Ht 5' 6.75" (1.695 m)   Wt 203 lb 12 oz (92.4 kg)   BMI 32.15 kg/m   GEN: WDWN, NAD, Non-toxic, A & O x 3 HEENT: Atraumatic, Normocephalic. Neck supple. No masses, No LAD. Ears and Nose: No external deformity. CV: RRR, No M/G/R. No JVD. No thrill. No extra heart sounds. PULM: CTA B, no wheezes, crackles, rhonchi. No retractions. No resp. distress. No accessory muscle use. EXTR: No c/c/e NEURO Normal gait.  PSYCH: Normally interactive. Conversant. Not depressed or anxious appearing.  Calm demeanor.   Laboratory and Imaging Data:  Assessment and Plan:   Pseudofolliculitis barbae  Overall health is doing well.  His pseudofolliculitis Barbae is not going to go away, and this is going to be a lifelong issue for him. I wrote him a accommodation.  Follow-up: No Follow-up on file.  Medications Discontinued During This Encounter  Medication Reason  . polyethylene glycol (MIRALAX / GLYCOLAX) packet One time medication  . oxyCODONE (ROXICODONE) 5 MG immediate release tablet One time medication  . ibuprofen (ADVIL,MOTRIN) 600 MG tablet One time medication  . docusate sodium (COLACE) 100 MG capsule One time medication  . acetaminophen (TYLENOL) 325 MG tablet One time medication    Signed,  Bernardo Brayman T. Caleel Kiner, MD   Allergies as of 02/19/2017   No Known Allergies     Medication List    as of 02/19/2017 10:14 AM   You have not been prescribed any medications.

## 2017-04-10 ENCOUNTER — Encounter: Payer: Self-pay | Admitting: Internal Medicine

## 2017-04-10 ENCOUNTER — Ambulatory Visit (INDEPENDENT_AMBULATORY_CARE_PROVIDER_SITE_OTHER): Payer: 59 | Admitting: Internal Medicine

## 2017-04-10 VITALS — BP 104/80 | HR 75 | Temp 98.4°F | Wt 211.0 lb

## 2017-04-10 DIAGNOSIS — N342 Other urethritis: Secondary | ICD-10-CM | POA: Insufficient documentation

## 2017-04-10 MED ORDER — CEFTRIAXONE SODIUM 250 MG IJ SOLR
250.0000 mg | Freq: Once | INTRAMUSCULAR | Status: AC
  Administered 2017-04-10: 250 mg via INTRAMUSCULAR

## 2017-04-10 MED ORDER — AZITHROMYCIN 250 MG PO TABS: ORAL_TABLET | ORAL | 0 refills | Status: DC

## 2017-04-10 NOTE — Patient Instructions (Signed)
Urethritis, Adult Urethritis is an inflammation of the tube through which urine exits your bladder (urethra). What are the causes? Urethritis is often caused by an infection in your urethra. The infection can be viral, like herpes. The infection can also be bacterial, like gonorrhea. What increases the risk? Risk factors of urethritis include:  Having sex without using a condom.  Having multiple sexual partners.  Having poor hygiene.  What are the signs or symptoms? Symptoms of urethritis are less noticeable in women than in men. These symptoms include:  Burning feeling when you urinate (dysuria).  Discharge from your urethra.  Blood in your urine (hematuria).  Urinating more than usual.  How is this diagnosed? To confirm a diagnosis of urethritis, your health care provider will do the following:  Ask about your sexual history.  Perform a physical exam.  Have you provide a sample of your urine for lab testing.  Use a cotton swab to gently collect a sample from your urethra for lab testing.  How is this treated? It is important to treat urethritis. Depending on the cause, untreated urethritis may lead to serious genital infections and possibly infertility. Urethritis caused by a bacterial infection is treated with antibiotic medicine. All sexual partners must be treated. Follow these instructions at home:  Do not have sex until the test results are known and treatment is completed, even if your symptoms go away before you finish treatment.  If you were prescribed an antibiotic, finish it all even if you start to feel better. Contact a health care provider if:  Your symptoms are not improved in 3 days.  Your symptoms are getting worse.  You develop abdominal pain or pelvic pain (in women).  You develop joint pain.  You have a fever. Get help right away if:  You have severe pain in the belly, back, or side.  You have repeated vomiting. This information is not  intended to replace advice given to you by your health care provider. Make sure you discuss any questions you have with your health care provider. Document Released: 01/15/2001 Document Revised: 12/28/2015 Document Reviewed: 03/22/2013 Elsevier Interactive Patient Education  2017 Elsevier Inc.  

## 2017-04-10 NOTE — Progress Notes (Signed)
   Subjective:    Patient ID: Zachary Simpson, male    DOB: May 25, 1991, 26 y.o.   MRN: [id number]  HPI Here for concerns about STD  New partner ~8/25 or just after Several times with sex Noticed some pain with voiding 3-4 days ago. Related to irritation from coitus Still having burning dysuria every time Then noticed some bumps--- like blisters. Irritated and itchy without pain  No fever Feels okay otherwise  No current outpatient prescriptions on file prior to visit.   No current facility-administered medications on file prior to visit.     No Known Allergies  Past Medical History:  Diagnosis Date  . Appendicitis     Past Surgical History:  Procedure Laterality Date  . APPENDECTOMY      Family History  Problem Relation Age of Onset  . Hypertension Mother   . Stroke Maternal Grandfather     Social History   Social History  . Marital status: Single    Spouse name: N/A  . Number of children: N/A  . Years of education: N/A   Occupational History  . other     [employer]  . 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 Smoking status: Former Games developermoker  . Smokeless tobacco: Never Used  . Alcohol use No  . Drug use: No  . Sexual activity: No   Other Topics Concern  . Not on file   Social History Narrative   Went to [employer] at [employer] then [place] Wants to do online courses at [employer].    Will start 2 year internship at [employer] [place]   Not married   Active [employer] starting in 2016   Currently 2018 in [employer]   Working in security   Review of Systems  Usually would use condoms No GI symptoms     Objective:   Physical Exam  Constitutional: No distress.  Genitourinary:  Genitourinary Comments: Marked inflammation and tenderness of urethra --swab done Multiple 1-582mm papules (probably warts)          Assessment & Plan:

## 2017-04-10 NOTE — Addendum Note (Signed)
Addended by: Eual FinesBRIDGES, Dion Sibal P on: 04/10/2017 05:32 PM   Modules accepted: Orders

## 2017-04-10 NOTE — Addendum Note (Signed)
Addended by: Roena MaladyEVONTENNO, MELANIE Y on: 04/10/2017 05:25 PM   Modules accepted: Orders

## 2017-04-10 NOTE — Addendum Note (Signed)
Addended by: Gregery NaVALENCIA, Oluwanifemi Susman P on: 04/10/2017 05:21 PM   Modules accepted: Orders

## 2017-04-10 NOTE — Assessment & Plan Note (Signed)
Clearly STD Likely chlamydia since no discharge but will give rocephin and azithromycin Apparent genital warts--- doesn't look like herpes. Discussed that he will now have this, but not clearly with symptoms all the time Will check RPR and HIV also--discussed

## 2017-04-11 LAB — C. TRACHOMATIS/N. GONORRHOEAE RNA
C. TRACHOMATIS RNA, TMA: NOT DETECTED
N. gonorrhoeae RNA, TMA: NOT DETECTED

## 2017-04-11 LAB — RPR: RPR: NONREACTIVE

## 2017-04-11 LAB — HIV ANTIBODY (ROUTINE TESTING W REFLEX): HIV 1&2 Ab, 4th Generation: NONREACTIVE

## 2017-04-17 ENCOUNTER — Telehealth: Payer: Self-pay

## 2017-04-17 MED ORDER — METRONIDAZOLE 500 MG PO TABS: 500.0000 mg | ORAL_TABLET | Freq: Two times a day (BID) | ORAL | 0 refills | Status: AC

## 2017-04-17 NOTE — Telephone Encounter (Signed)
Pt left v/m; pt seen 04/10/17; pt has fishy smell in genitalia. Is not from sweating or musk scent. Pt wants to know if this is expected; how long will this take to go away or does pt need med or some type wash to use. [facility]

## 2017-04-17 NOTE — Telephone Encounter (Signed)
Since his partner had vaginosis, okay to Rx with metronidazole  bid x 7 days #14 x 0 Make sure he knows he should not drink alcohol during this treatment

## 2017-04-17 NOTE — Telephone Encounter (Signed)
Spoke to pt. He will start the med abx. I advised him not to drink alcohol and that the medication can turn his urine orange.

## 2017-05-12 IMAGING — CT CT ABD-PELV W/ CM
2 of 4 series · 16 of 46 positions shown, 18 images · IV contrast (APPLIED)
Comparison: None.

CLINICAL DATA: Upper abdominal pain now radiating to lower abdomen.
No bowel movement for 2 days. Ruptured appendicitis 2 months ago. No
fever.

EXAM:
CT ABDOMEN AND PELVIS WITH CONTRAST
TECHNIQUE: Multidetector CT imaging of the abdomen and pelvis was performed
using the standard protocol following bolus administration of
intravenous contrast.
CONTRAST:  100mL RYSKP9-GVV IOPAMIDOL (RYSKP9-GVV) INJECTION 61%

[Series 3: routine abd/pel with . · axial · 0.74mm/px · z∈[-846,-432]mm · 13 of 91 slices shown, 15 images]
[im 4/91  soft-tissue]
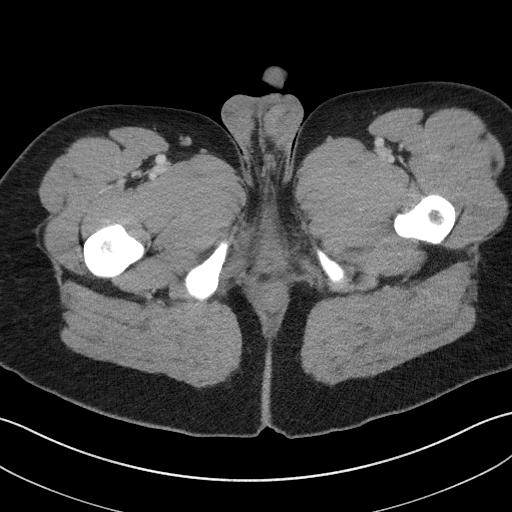
[im 4/91  bone]
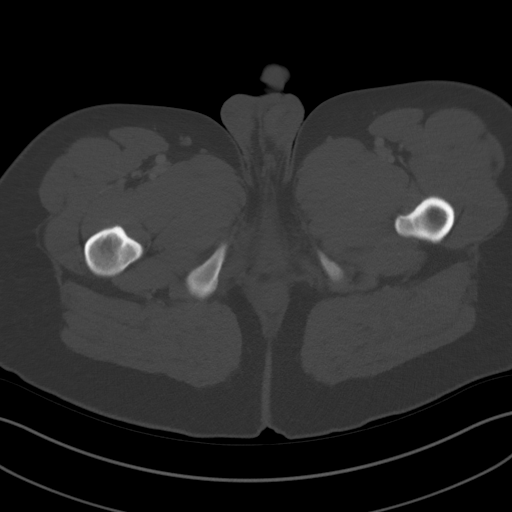
[im 11/91  soft-tissue]
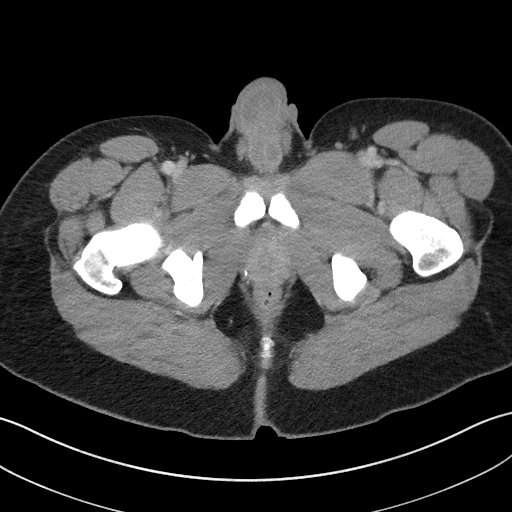
[im 19/91  soft-tissue]
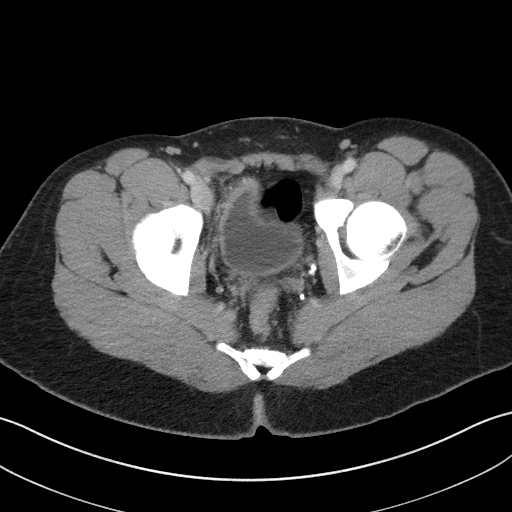
[im 26/91  soft-tissue]
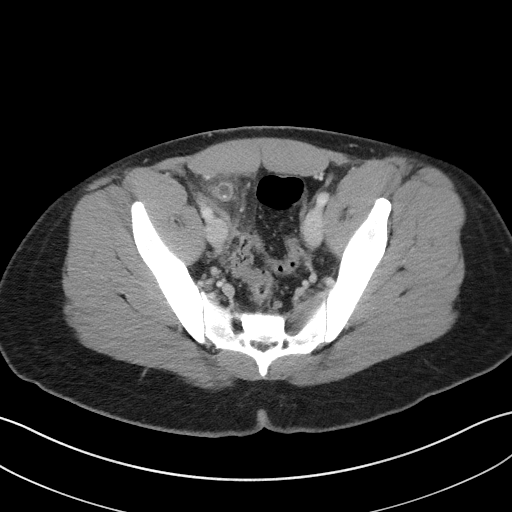
[im 33/91  soft-tissue]
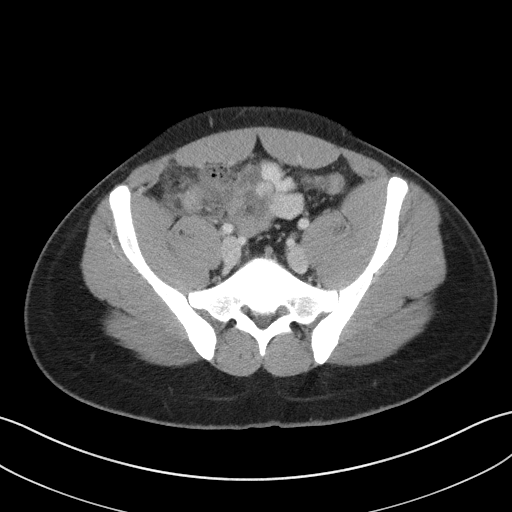
[im 40/91  soft-tissue]
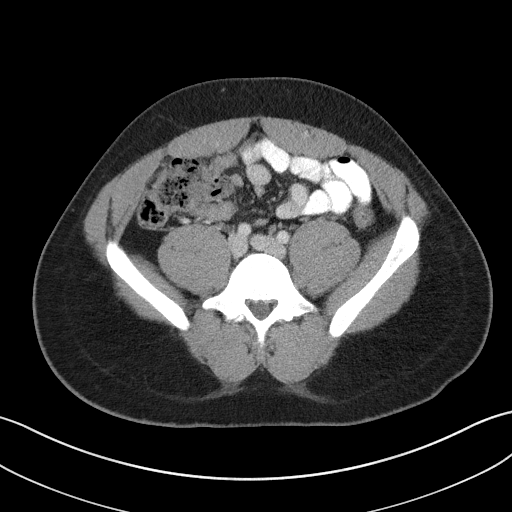
[im 47/91  soft-tissue]
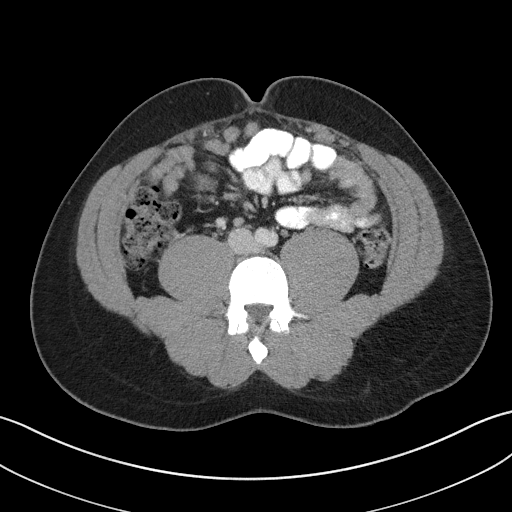
[im 51/91  soft-tissue]
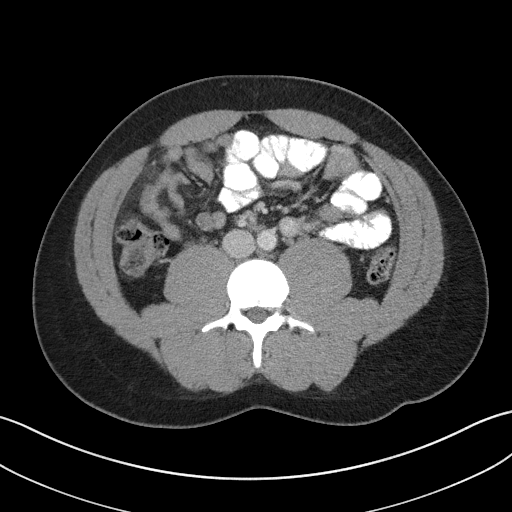
[im 58/91  soft-tissue]
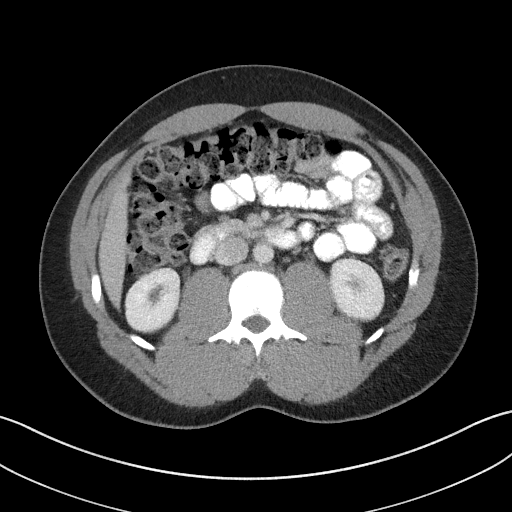
[im 58/91  bone]
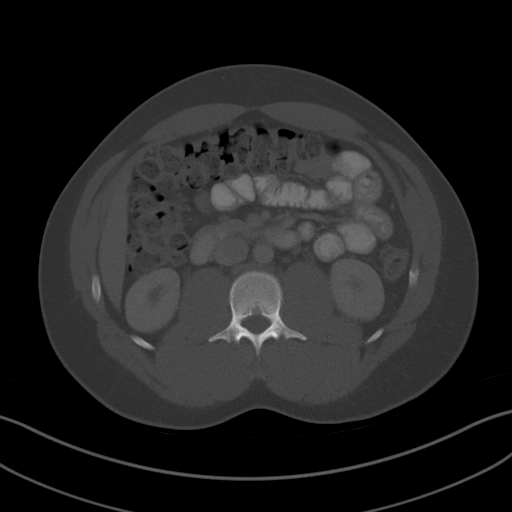
[im 65/91  soft-tissue]
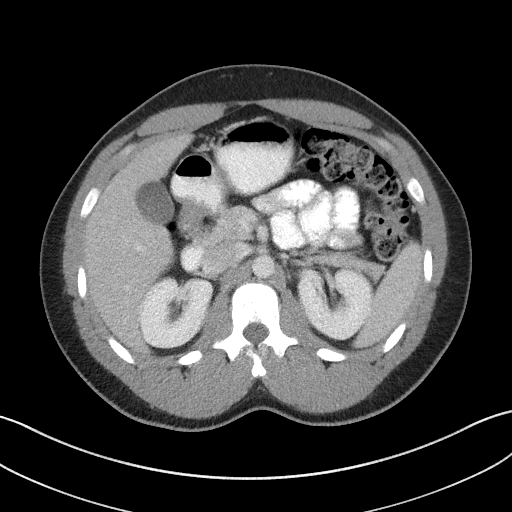
[im 73/91  soft-tissue]
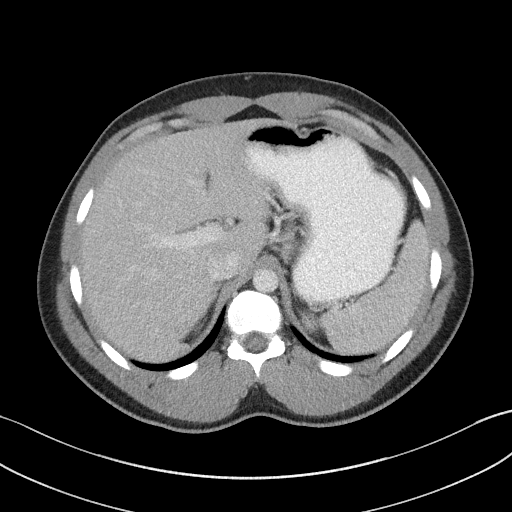
[im 80/91  soft-tissue]
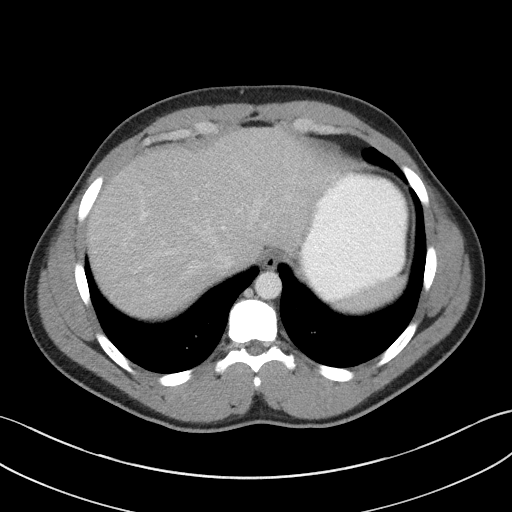
[im 87/91  soft-tissue]
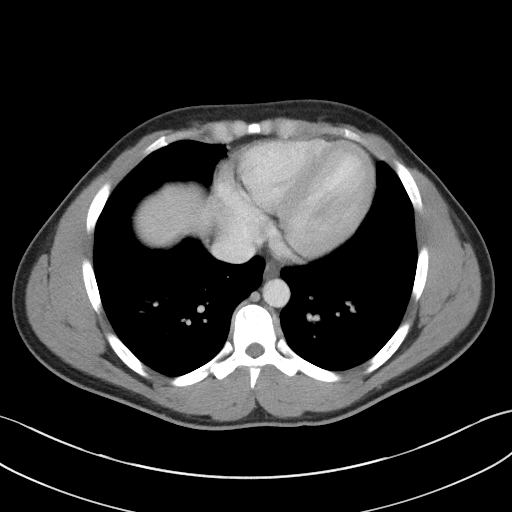

[Series 6: coronal st · coronal · 0.68mm/px · 3 of 91 slices shown]
[im 31/91  soft-tissue]
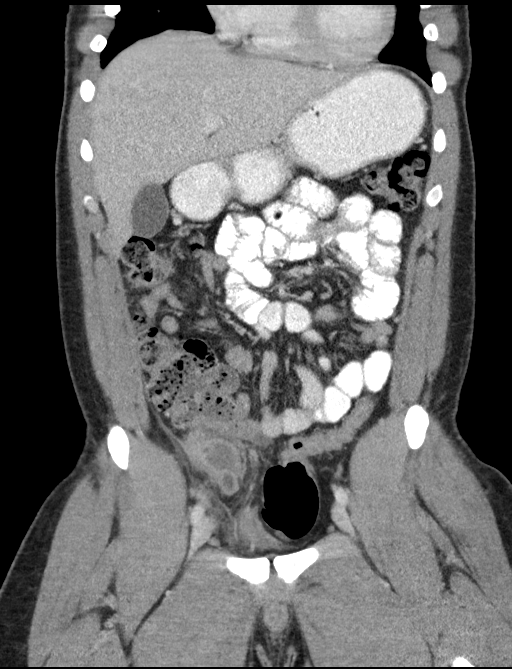
[im 41/91  soft-tissue]
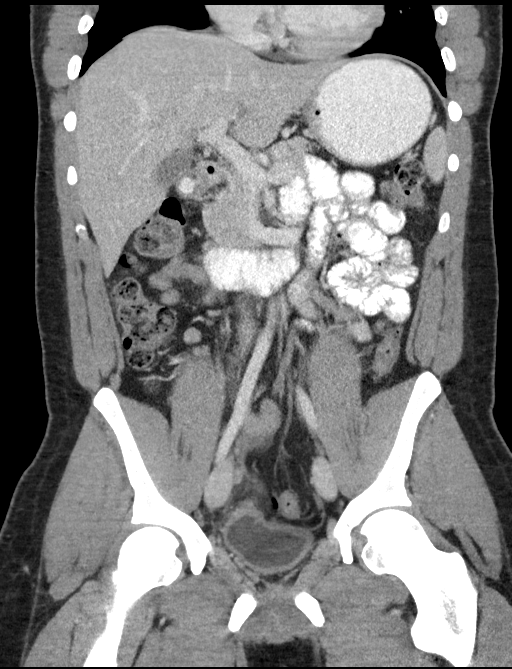
[im 51/91  soft-tissue]
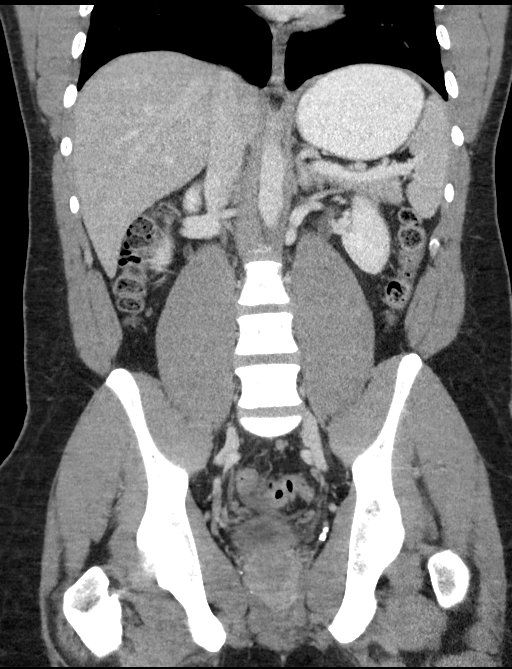

[16 of 46 positions shown; findings below may reference images not displayed]

FINDINGS: LOWER CHEST: Lung bases are clear. Included heart size is normal. No
pericardial effusion.

HEPATOBILIARY: Liver and gallbladder are normal.

PANCREAS: Normal.

SPLEEN: Normal.

ADRENALS/URINARY TRACT: Kidneys are orthotopic, demonstrating
symmetric enhancement. No nephrolithiasis, hydronephrosis or solid
renal masses. The unopacified ureters are normal in course and
caliber. Urinary bladder is partially distended and unremarkable.
Normal adrenal glands.

STOMACH/BOWEL: The stomach, small and large bowel are normal in
course and caliber without inflammatory changes. Appendix is 13 mm
in transaxial dimension with hyperemic wall thickening and, 4 mm
appendicolith. Surrounding 3.2 x 2.2 cm fluid collection with rim
enhancement and fat stranding. Moderate amount of retained large
bowel stool.

VASCULAR/LYMPHATIC: Aortoiliac vessels are normal in course and
caliber. No lymphadenopathy by CT size criteria.

REPRODUCTIVE: Normal.

OTHER: Small amount of free fluid in the pelvis. No intraperitoneal
free air.

MUSCULOSKELETAL: Nonacute.
IMPRESSION: Perforated appendicitis, 3.2 x 2.2 cm para appendiceal abscess.

Moderate amount of retained large bowel stool. No bowel obstruction.

## 2017-07-09 ENCOUNTER — Other Ambulatory Visit: Payer: Self-pay

## 2017-07-09 ENCOUNTER — Ambulatory Visit (INDEPENDENT_AMBULATORY_CARE_PROVIDER_SITE_OTHER): Payer: 59 | Admitting: Family Medicine

## 2017-07-09 ENCOUNTER — Encounter: Payer: Self-pay | Admitting: Family Medicine

## 2017-07-09 VITALS — BP 120/76 | HR 77 | Temp 98.4°F | Ht 66.75 in | Wt 213.5 lb

## 2017-07-09 DIAGNOSIS — N481 Balanitis: Secondary | ICD-10-CM

## 2017-07-09 MED ORDER — FLUCONAZOLE 150 MG PO TABS: ORAL_TABLET | ORAL | 0 refills | Status: DC

## 2017-07-09 MED ORDER — METRONIDAZOLE 500 MG PO TABS: 500.0000 mg | ORAL_TABLET | Freq: Three times a day (TID) | ORAL | 0 refills | Status: AC

## 2017-07-09 NOTE — Progress Notes (Signed)
Dr. Karleen HampshireSpencer T. Luwanna Brossman, MD, CAQ Sports [facility] Primary [facility] Medicine [address] [place], [id number] Phone: [phone] Fax: [phone]  07/09/2017  Patient: Zachary FetchDerion Chriswell, MRN: [id number], DOB: 1991-04-18, 26 y.o.  Primary Physician:  Hannah Beatopland, Samiah Ricklefs, MD   Chief Complaint  Patient presents with  . Personal Problem    Wants Genital Area checked   Subjective:   Tradarius Wiliam Keewell is a 26 y.o. very pleasant male patient who presents with the following:  Patient wanted me to evaluate his genitourinary area.  So my partner in September, and at that point he got a dose of Rocephin and azithromycin for presumed urethritis, but is gonorrhea and Chlamydia cultures came back negative.  Ultimately he was treated with a round of Flagyl which seemed to help with his disorder from this region.  This came back about 2 weeks after his initial dose.  His girlfriend also had a round of BV also.  Today he does have some burning pain around and some itching inferior to the glans carina.  Past Medical History, Surgical History, Social History, Family History, Problem List, Medications, and Allergies have been reviewed and updated if relevant.  Patient Active Problem List   Diagnosis Date Noted  . Urethritis 04/10/2017  . 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 Ruptured appendicitis 08/06/2016  . EXTERNAL HEMORRHOIDS 03/02/2010    Past Medical History:  Diagnosis Date  . Appendicitis     Past Surgical History:  Procedure Laterality Date  . APPENDECTOMY      Social History   Socioeconomic History  . Marital status: Single    Spouse name: Not on file  . Number of children: Not on file  . Years of education: Not on file  . Highest education level: Not on file  Social Needs  . Financial resource strain: Not on file  . Food insecurity - worry: Not on file  . Food insecurity - inability: Not on file  . Transportation needs - medical: Not on file  . Transportation  needs - non-medical: Not on file  Occupational History  . Occupation: other    Comment: [employer]  . Occupation: Office managersecurity  . 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 Smoking status: Former Games developermoker  . Smokeless tobacco: Never Used  Substance and Sexual Activity  . Alcohol use: No  . Drug use: No  . Sexual activity: No  Other Topics Concern  . Not on file  Social History Narrative   Went to [employer] at [employer] then [place] Wants to do online courses at [employer].    Will start 2 year internship at [employer] [place]   Not married   Active [employer] starting in 2016   Currently 2018 in [employer]   Working in security    Family History  Problem Relation Age of Onset  . Hypertension Mother   . Stroke Maternal Grandfather     No Known Allergies  Medication list reviewed and updated in full in [place] [facility].   GEN: No acute illnesses, no fevers, chills. GI: No n/v/d, eating normally Pulm: No SOB Interactive and getting along well at home.  Otherwise, ROS is as per the HPI.  Objective:   BP 120/76   Pulse 77   Temp 98.4 F (36.9 C) (Oral)   Ht 5' 6.75" (1.695 m)   Wt 213 lb 8 oz (96.8 kg)   BMI 33.69 kg/m   GEN: WDWN, NAD, Non-toxic, A & O x 3 HEENT:  Atraumatic, Normocephalic. Neck supple. No masses, No LAD. Ears and Nose: No external deformity. CV: RRR, No M/G/R. No JVD. No thrill. No extra heart sounds. PULM: CTA B, no wheezes, crackles, rhonchi. No retractions. No resp. distress. No accessory muscle use. EXTR: No c/c/e NEURO Normal gait.  PSYCH: Normally interactive. Conversant. Not depressed or anxious appearing.  Calm demeanor.   GU: Underneath the inferior ridge of the glans, the patient does have some skin breakage as well as some irritation.  There are no ulcerations.  No penile discharge.   Laboratory and Imaging Data: Results for orders placed or performed in visit on 04/10/17  C. trachomatis/N. gonorrhoeae RNA  Result Value Ref Range   C.  trachomatis RNA, TMA NOT DETECTED NOT DETECT   N. gonorrhoeae RNA, TMA NOT DETECTED NOT DETECT  HIV antibody (with reflex)  Result Value Ref Range   HIV 1&2 Ab, 4th Generation NON-REACTIVE NON-REACTI  RPR  Result Value Ref Range   RPR Ser Ql NON-REACTIVE NON-REACTI     Assessment and Plan:   Balanitis  On exam, she has more of an appearance of balanitis, but given the disorder and his response to Flagyl in the past, I am going to treat him with both Diflucan as well as metronidazole.  He is monogamous with his girlfriend and has had recent STD testing that is normal.  Follow-up: No Follow-up on file.  Meds ordered this encounter  Medications  . metroNIDAZOLE (FLAGYL) 500 MG tablet    Sig: Take 1 tablet (500 mg total) by mouth 3 (three) times daily for 10 days.    Dispense:  30 tablet    Refill:  0  . fluconazole (DIFLUCAN) 150 MG tablet    Sig: 1 tablet by mouth every 3rd day, repeat every 3rd day until empty    Dispense:  3 tablet    Refill:  0   Medications Discontinued During This Encounter  Medication Reason  . azithromycin (ZITHROMAX) 250 MG tablet Completed Course   Signed,  Karleen HampshireSpencer T. Maryjean Corpening, MD   Allergies as of 07/09/2017   No Known Allergies     Medication List        Accurate as of 07/09/17 11:59 PM. Always use your most recent med list.          fluconazole 150 MG tablet Commonly known as:  DIFLUCAN 1 tablet by mouth every 3rd day, repeat every 3rd day until empty   metroNIDAZOLE 500 MG tablet Commonly known as:  FLAGYL Take 1 tablet (500 mg total) by mouth 3 (three) times daily for 10 days.

## 2018-05-11 ENCOUNTER — Ambulatory Visit: Payer: Self-pay | Admitting: Family Medicine

## 2018-05-11 ENCOUNTER — Telehealth: Payer: Self-pay

## 2018-05-11 NOTE — Telephone Encounter (Signed)
PLEASE NOTE: All timestamps contained within this report are represented as [place] Standard Time. CONFIDENTIALTY NOTICE: This fax transmission is intended only for the addressee. It contains information that is legally privileged, confidential or otherwise protected from use or disclosure. If you are not the intended recipient, you are strictly prohibited from reviewing, disclosing, copying using or disseminating any of this information or taking any action in reliance on or regarding this information. If you have received this fax in error, please notify us immediately by telephone so that we can arrange for its return to [place]. Phone: [phone], Toll-Free: [phone], Fax: [phone] Page: 1 of 1 Call Id: [id number] [facility] Night - Client Nonclinical Telephone Record [facility] Medical Call Center Client [place] [facility] Night - Client Client Site  [facility] [place] - Night Physician Hannah Beat - MD Contact Type Call Who Is Calling Patient / Member / Family / Caregiver Caller Name Arsalan Shawndale Kilpatrick Phone Number [phone] Patient Name Zachary Simpson Patient DOB 1991-01-14 Call Type Message Only Information Provided Reason for Call Request to Cancel Office Appointment Initial Comment Caller states he would like to cancel his appointment tomorrow. Additional Comment Call Closed By: Ellis Parents Transaction Date/Time: 05/10/2018 8:17:46 PM (ET)

## 2018-05-11 NOTE — Telephone Encounter (Signed)
Pt had appt to see Dr Patsy Lager on 05/11/18 at 8 AM. FYI to [place].

## 2018-05-11 NOTE — Telephone Encounter (Signed)
I cancelled no show since pt called the day before.

## 2018-05-12 ENCOUNTER — Other Ambulatory Visit (INDEPENDENT_AMBULATORY_CARE_PROVIDER_SITE_OTHER): Payer: Self-pay

## 2018-05-12 DIAGNOSIS — Z0000 Encounter for general adult medical examination without abnormal findings: Secondary | ICD-10-CM

## 2018-05-12 DIAGNOSIS — Z113 Encounter for screening for infections with a predominantly sexual mode of transmission: Secondary | ICD-10-CM

## 2018-05-12 LAB — CBC WITH DIFFERENTIAL/PLATELET
BASOS PCT: 0.6 % (ref 0.0–3.0)
Basophils Absolute: 0 10*3/uL (ref 0.0–0.1)
EOS PCT: 2.4 % (ref 0.0–5.0)
Eosinophils Absolute: 0.1 10*3/uL (ref 0.0–0.7)
HCT: 46.2 % (ref 39.0–52.0)
HEMOGLOBIN: 15.6 g/dL (ref 13.0–17.0)
Lymphocytes Relative: 41.5 % (ref 12.0–46.0)
Lymphs Abs: 2.1 10*3/uL (ref 0.7–4.0)
MCHC: 33.6 g/dL (ref 30.0–36.0)
MCV: 80.3 fl (ref 78.0–100.0)
MONO ABS: 0.5 10*3/uL (ref 0.1–1.0)
MONOS PCT: 9.9 % (ref 3.0–12.0)
Neutro Abs: 2.3 10*3/uL (ref 1.4–7.7)
Neutrophils Relative %: 45.6 % (ref 43.0–77.0)
Platelets: 212 10*3/uL (ref 150.0–400.0)
RBC: 5.76 Mil/uL (ref 4.22–5.81)
RDW: 13.6 % (ref 11.5–15.5)
WBC: 5.1 10*3/uL (ref 4.0–10.5)

## 2018-05-12 LAB — COMPREHENSIVE METABOLIC PANEL
ALBUMIN: 3.9 g/dL (ref 3.5–5.2)
ALT: 16 U/L (ref 0–53)
AST: 22 U/L (ref 0–37)
Alkaline Phosphatase: 77 U/L (ref 39–117)
BUN: 14 mg/dL (ref 6–23)
CALCIUM: 9.3 mg/dL (ref 8.4–10.5)
CHLORIDE: 104 meq/L (ref 96–112)
CO2: 29 mEq/L (ref 19–32)
Creatinine, Ser: 1.27 mg/dL (ref 0.40–1.50)
GFR: 88.3 mL/min (ref >60.00)
GLUCOSE: 106 mg/dL — AB (ref 70–99)
POTASSIUM: 4 meq/L (ref 3.5–5.1)
Sodium: 141 mEq/L (ref 135–145)
Total Bilirubin: 0.5 mg/dL (ref 0.2–1.2)
Total Protein: 7 g/dL (ref 6.0–8.3)

## 2018-05-12 LAB — LIPID PANEL
CHOLESTEROL: 144 mg/dL (ref 0–200)
HDL: 48.2 mg/dL (ref >39.00)
LDL CALC: 83 mg/dL (ref 0–99)
NonHDL: 95.4
TRIGLYCERIDES: 61 mg/dL (ref 0.0–149.0)
Total CHOL/HDL Ratio: 3
VLDL: 12.2 mg/dL (ref 0.0–40.0)

## 2018-05-12 NOTE — Addendum Note (Signed)
Addended by: Alvina Chou on: 05/12/2018 08:15 AM   Modules accepted: Orders

## 2018-05-13 LAB — HIV ANTIBODY (ROUTINE TESTING W REFLEX): HIV: NONREACTIVE

## 2018-05-13 LAB — RPR: RPR: NONREACTIVE

## 2018-05-17 NOTE — Progress Notes (Deleted)
Dr. Frederico Hamman T. Makayela Secrest, MD, [facility] [place], [address] Phone: [phone] Fax: [phone]  05/18/2018  Patient: Zachary Simpson, MRN: [id number], DOB: 08/01/1991, 27 y.o.  Primary Physician:  Owens Loffler, MD   No chief complaint on file.  Subjective:   Bradden Rammel is a 27 y.o. pleasant patient who presents with the following:  Preventative Health Maintenance Visit:  Health Maintenance Summary Reviewed and updated, unless pt declines services.  Tobacco History Reviewed. Alcohol: No concerns, no excessive use Exercise Habits: Some activity, rec at least 30 mins 5 times a week STD concerns: no risk or activity to increase risk Drug Use: None Encouraged self-testicular check  Health Maintenance  Topic Date Due  . TETANUS/TDAP  07/27/2011  . INFLUENZA VACCINE  03/05/2018  . HIV Screening  Completed    There is no immunization history on file for this patient. Patient Active Problem List   Diagnosis Date Noted  . Urethritis 04/10/2017  . Pseudofolliculitis barbae 10/17/9456  . Ruptured appendicitis 08/06/2016  . EXTERNAL HEMORRHOIDS 03/02/2010   Past Medical History:  Diagnosis Date  . Appendicitis    Past Surgical History:  Procedure Laterality Date  . APPENDECTOMY     Social History   Socioeconomic History  . Marital status: Single    Spouse name: Not on file  . Number of children: Not on file  . Years of education: Not on file  . Highest education level: Not on file  Occupational History  . Occupation: other    Comment: Adult nurse  . Occupation: Land  . Occupation: Technical brewer  . Financial resource strain: Not on file  . Food insecurity:    Worry: Not on file    Inability: Not on file  . Transportation needs:    Medical: Not on file    Non-medical: Not on file  Tobacco Use  . Smoking status: Former Research scientist (life sciences)  . Smokeless tobacco: Never Used  Substance  and Sexual Activity  . Alcohol use: No  . Drug use: No  . Sexual activity: Never  Lifestyle  . Physical activity:    Days per week: Not on file    Minutes per session: Not on file  . Stress: Not on file  Relationships  . Social connections:    Talks on phone: Not on file    Gets together: Not on file    Attends religious service: Not on file    Active member of club or organization: Not on file    Attends meetings of clubs or organizations: Not on file    Relationship status: Not on file  . Intimate partner violence:    Fear of current or ex partner: Not on file    Emotionally abused: Not on file    Physically abused: Not on file    Forced sexual activity: Not on file  Other Topics Concern  . Not on file  Social History Narrative   Went to [employer] at [employer], then [place]. Wants to do online courses at [employer].    Will start 2 year internship at [employer] in [place]   Not married   Active [employer] starting in 2016   Currently 2018 in [employer]   Working in security   Family History  Problem Relation Age of Onset  . Hypertension Mother   . Stroke Maternal Grandfather    No Known Allergies  Medication list has been reviewed and updated.  General: Denies fever, chills, sweats. No significant weight loss. Eyes: Denies blurring,significant itching ENT: Denies earache, sore throat, and hoarseness. Cardiovascular: Denies chest pains, palpitations, dyspnea on exertion Respiratory: Denies cough, dyspnea at rest,wheeezing Breast: no concerns about lumps GI: Denies nausea, vomiting, diarrhea, constipation, change in bowel habits, abdominal pain, melena, hematochezia GU: Denies penile discharge, ED, urinary flow / outflow problems. No STD concerns. Musculoskeletal: Denies back pain, joint pain Derm: Denies rash, itching Neuro: Denies  paresthesias, frequent falls, frequent headaches Psych: Denies depression, anxiety Endocrine: Denies cold intolerance, heat intolerance,  polydipsia Heme: Denies enlarged lymph nodes Allergy: No hayfever  Objective:   There were no vitals taken for this visit. Ideal Body Weight:    No exam data present  GEN: well developed, well nourished, no acute distress Eyes: conjunctiva and lids normal, PERRLA, EOMI ENT: TM clear, nares clear, oral exam WNL Neck: supple, no lymphadenopathy, no thyromegaly, no JVD Pulm: clear to auscultation and percussion, respiratory effort normal CV: regular rate and rhythm, S1-S2, no murmur, rub or gallop, no bruits, peripheral pulses normal and symmetric, no cyanosis, clubbing, edema or varicosities GI: soft, non-tender; no hepatosplenomegaly, masses; active bowel sounds all quadrants GU: no hernia, testicular mass, penile discharge Lymph: no cervical, axillary or inguinal adenopathy MSK: gait normal, muscle tone and strength WNL, no joint swelling, effusions, discoloration, crepitus  SKIN: clear, good turgor, color WNL, no rashes, lesions, or ulcerations Neuro: normal mental status, normal strength, sensation, and motion Psych: alert; oriented to person, place and time, normally interactive and not anxious or depressed in appearance. All labs reviewed with patient.  Lipids:    Component Value Date/Time   CHOL 144 05/12/2018 0745   TRIG 61.0 05/12/2018 0745   HDL 48.20 05/12/2018 0745   VLDL 12.2 05/12/2018 0745   CHOLHDL 3 05/12/2018 0745   CBC: CBC Latest Ref Rng & Units 05/12/2018 08/08/2016 08/07/2016  WBC 4.0 - 10.5 K/uL 5.1 5.8 7.0  Hemoglobin 13.0 - 17.0 g/dL 15.6 13.8 14.3  Hematocrit 39.0 - 52.0 % 46.2 41.2 41.8  Platelets 150.0 - 400.0 K/uL 212.0 198 376    Basic Metabolic Panel:    Component Value Date/Time   NA 141 05/12/2018 0745   K 4.0 05/12/2018 0745   CL 104 05/12/2018 0745   CO2 29 05/12/2018 0745   BUN 14 05/12/2018 0745   CREATININE 1.27 05/12/2018 0745   GLUCOSE 106 (H) 05/12/2018 0745   CALCIUM 9.3 05/12/2018 0745   Hepatic Function Latest Ref Rng & Units  05/12/2018 08/05/2016  Total Protein 6.0 - 8.3 g/dL 7.0 8.2(H)  Albumin 3.5 - 5.2 g/dL 3.9 4.3  AST 0 - 37 U/L 22 21  ALT 0 - 53 U/L 16 19  Alk Phosphatase 39 - 117 U/L 77 82  Total Bilirubin 0.2 - 1.2 mg/dL 0.5 0.8    No results found for: TSH No results found for: PSA  Assessment and Plan:   Healthcare maintenance  Health Maintenance Exam: The patient's preventative maintenance and recommended screening tests for an annual wellness exam were reviewed in full today. Brought up to date unless services declined.  Counselled on the importance of diet, exercise, and its role in overall health and mortality. The patient's FH and SH was reviewed, including their home life, tobacco status, and drug and alcohol status.  Follow-up in 1 year for physical exam or additional follow-up below.  Follow-up: No follow-ups on file. Or follow-up in 1 year if not noted.  Signed,  Maud Deed. Kennede Lusk, MD  Allergies as of 05/18/2018   No Known Allergies     Medication List        Accurate as of 05/17/18  2:40 PM. Always use your most recent med list.          fluconazole 150 MG tablet Commonly known as:  DIFLUCAN 1 tablet by mouth every 3rd day, repeat every 3rd day until empty

## 2018-05-18 ENCOUNTER — Ambulatory Visit: Payer: Self-pay | Admitting: Family Medicine

## 2018-05-20 ENCOUNTER — Telehealth: Payer: Self-pay | Admitting: Family Medicine

## 2018-05-20 ENCOUNTER — Telehealth: Payer: Self-pay | Admitting: *Deleted

## 2018-05-20 NOTE — Telephone Encounter (Signed)
Copied from CRM [id number]. 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 Pt would like to see if this can be completed for him by Monday. Please contact pt. He states he needs this by Monday. >> May 20, 2018 11:15 AM Patience Musca, LPN wrote: Pt seen 02/19/17 to reestablish care.

## 2018-05-20 NOTE — Telephone Encounter (Signed)
I haven't done a physical in 5 1/2 years on him. Someone needs to evaluate to complete paperwork.   I apologize for Monday - unfortunately family emergencies happen to doctors, too.

## 2018-05-20 NOTE — Telephone Encounter (Signed)
Copied from CRM [id number]. Topic: Quick Communication - See Telephone Encounter >> May 20, 2018  2:45 PM Luanna Cole wrote: CRM for notification. See Telephone encounter for: 05/20/18. Pt called and stated that he would like a call back from Dr. Patsy Lager. 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 Please advise [phone]

## 2018-05-20 NOTE — Telephone Encounter (Signed)
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  He wanted to know if this paper could be sign  Its call the " popat test" Below it what he needs to be able to do.  Run 40 feet around cone and back 2 x 4 foot broad jump Clear 4 foot fence Craw under 27ft obstacle 3 bag rolls 20 push up 3 more bag rolls 30 steps on step box  He wanted to know if he needs to be seen in office before paperwork could be signed can he see someone else.

## 2018-05-20 NOTE — Telephone Encounter (Signed)
Zachary Simpson has not had a CPE in over a year.  Had appointment scheduled for Monday 05/18/18 but was cancelled due to Dr. Patsy Lager call out so we can not complete form.  Will see if [name] reschedule him to see Dr. Patsy Lager on Monday 05/25/18.

## 2018-05-20 NOTE — Telephone Encounter (Signed)
He can see someone else if you can get him in Thurs or Friday.

## 2018-05-21 NOTE — Telephone Encounter (Signed)
Is this for a physical? 

## 2018-05-21 NOTE — Telephone Encounter (Signed)
Zachary Simpson Can I schedule pt with you Friday @ 4

## 2018-05-21 NOTE — Telephone Encounter (Signed)
Spoke with Zachary Simpson.  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  He is requesting a earlier appointment.  I rescheduled him with Dr. Patrice Paradise at noon on Friday and advised that tomorrow Dr. Reece Agar will only see him to completed his paperwork and he will need to reschedule his physical with Dr. Patsy Lager at his convenience.  Patient states understanding.  He does not need Dr. Patsy Lager to call him back at this time.

## 2018-05-21 NOTE — Telephone Encounter (Signed)
Appointment 10/18 @ 4  Pt aware Ok Zachary Simpson

## 2018-05-21 NOTE — Telephone Encounter (Signed)
This is very confusing. It looks like there have been 10 communications between him and various people in our office since this message's original time.   He has an appt with regina on Friday.  Do I still need to contact him?

## 2018-05-22 ENCOUNTER — Encounter: Payer: Self-pay | Admitting: Family Medicine

## 2018-05-22 ENCOUNTER — Ambulatory Visit: Payer: Self-pay | Admitting: Family Medicine

## 2018-05-22 ENCOUNTER — Ambulatory Visit: Payer: Self-pay | Admitting: Internal Medicine

## 2018-05-22 VITALS — BP 118/74 | HR 62 | Temp 98.4°F | Ht 66.75 in | Wt 215.2 lb

## 2018-05-22 DIAGNOSIS — Z0279 Encounter for issue of other medical certificate: Secondary | ICD-10-CM | POA: Insufficient documentation

## 2018-05-22 DIAGNOSIS — R739 Hyperglycemia, unspecified: Secondary | ICD-10-CM | POA: Insufficient documentation

## 2018-05-22 NOTE — Assessment & Plan Note (Signed)
He has a sweet tooth. Encouraged avoid added sugars in diet.

## 2018-05-22 NOTE — Progress Notes (Signed)
BP 118/74 (BP Location: Left Arm, Patient Position: Sitting, Cuff Size: Large)   Pulse 62   Temp 98.4 F (36.9 C) (Oral)   Ht 5' 6.75" (1.695 m)   Wt 215 lb 4 oz (97.6 kg)   SpO2 99%   BMI 33.97 kg/m    Hearing Screening   125Hz  250Hz  500Hz  1000Hz  2000Hz  3000Hz  4000Hz  6000Hz  8000Hz   Right ear:   20 20 20  20     Left ear:   20 20 20  20       Visual Acuity Screening   Right eye Left eye Both eyes  Without correction: 20/15 20/13 20/13   With correction:         CC: visit to complete forms Subjective:    Patient ID: Zachary Simpson, male    DOB: October 28, 1990, 27 y.o.   MRN: [id number]  HPI: Zachary Simpson is a 27 y.o. male presenting on 05/22/2018 for Annual Exam (Pt provided 2 forms to complete. )   Self pay. Financial planner - [employer]  Preventative: Flu shot - declines Tetanus unsure - will check on this Seat belt use discussed Sunscreen use discussed. New mole on left hand noted this year.  Non smoker Alcohol - socially 3-5 mixed drinks/month, no binging  Rec drugs - none Currently sexually active - 3-4 partners in last year, uses protection. Recent normal STD screen.  Dentist - yearly - no insurance. Eye exam - yearly  Went to [employer] at [employer], then [place]. Wants to do online courses at [facility] [employer].  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 Interim medical history since our last visit reviewed. Allergies and medications reviewed and updated. Outpatient Medications Prior to Visit  Medication Sig Dispense Refill  . fluconazole (DIFLUCAN) 150 MG tablet 1 tablet by mouth every 3rd day, repeat every 3rd day until empty 3  tablet 0   No facility-administered medications prior to visit.      Per HPI unless specifically indicated in ROS section below Review of Systems     Objective:    BP 118/74 (BP Location: Left Arm, Patient Position: Sitting, Cuff Size: Large)   Pulse 62   Temp 98.4 F (36.9 C) (Oral)   Ht 5' 6.75" (1.695 m)   Wt 215 lb 4 oz (97.6 kg)   SpO2 99%   BMI 33.97 kg/m   Wt Readings from Last 3 Encounters:  05/22/18 215 lb 4 oz (97.6 kg)  07/09/17 213 lb 8 oz (96.8 kg)  04/10/17 211 lb (95.7 kg)    Physical Exam  Constitutional: He is oriented to person, place, and time. He appears well-developed and well-nourished. No distress.  HENT:  Mouth/Throat: Oropharynx is clear and moist. No oropharyngeal exudate.  Cardiovascular: Normal rate, regular rhythm and normal heart sounds.  No murmur heard. Pulmonary/Chest: Effort normal and breath sounds normal. No respiratory distress. He has no wheezes. He has no rales.  Musculoskeletal: He exhibits no edema.  Neurological: He is alert and oriented to person, place, and time.  Psychiatric: He has a normal mood and affect.  Nursing note and vitals reviewed.  Results for orders placed or performed in visit on 05/12/18  Lipid panel  Result  Value Ref Range   Cholesterol 144 0 - 200 mg/dL   Triglycerides 16.1 0.0 - 149.0 mg/dL   HDL 09.60 >45.40 mg/dL   VLDL 98.1 0.0 - 19.1 mg/dL   LDL Cholesterol 83 0 - 99 mg/dL   Total CHOL/HDL Ratio 3    NonHDL 95.40   Comprehensive metabolic panel  Result Value Ref Range   Sodium 141 135 - 145 mEq/L   Potassium 4.0 3.5 - 5.1 mEq/L   Chloride 104 96 - 112 mEq/L   CO2 29 19 - 32 mEq/L   Glucose, Bld 106 (H) 70 - 99 mg/dL   BUN 14 6 - 23 mg/dL   Creatinine, Ser 4.78 0.40 - 1.50 mg/dL   Total Bilirubin 0.5 0.2 - 1.2 mg/dL   Alkaline Phosphatase 77 39 - 117 U/L   AST 22 0 - 37 U/L   ALT 16 0 - 53 U/L   Total Protein 7.0 6.0 - 8.3 g/dL   Albumin 3.9 3.5 - 5.2 g/dL   Calcium 9.3 8.4 - 29.5 mg/dL    GFR 62.13 >08.65 mL/min  CBC with Differential/Platelet  Result Value Ref Range   WBC 5.1 4.0 - 10.5 K/uL   RBC 5.76 4.22 - 5.81 Mil/uL   Hemoglobin 15.6 13.0 - 17.0 g/dL   HCT 78.4 69.6 - 29.5 %   MCV 80.3 78.0 - 100.0 fl   MCHC 33.6 30.0 - 36.0 g/dL   RDW 28.4 13.2 - 44.0 %   Platelets 212.0 150.0 - 400.0 K/uL   Neutrophils Relative % 45.6 43.0 - 77.0 %   Lymphocytes Relative 41.5 12.0 - 46.0 %   Monocytes Relative 9.9 3.0 - 12.0 %   Eosinophils Relative 2.4 0.0 - 5.0 %   Basophils Relative 0.6 0.0 - 3.0 %   Neutro Abs 2.3 1.4 - 7.7 K/uL   Lymphs Abs 2.1 0.7 - 4.0 K/uL   Monocytes Absolute 0.5 0.1 - 1.0 K/uL   Eosinophils Absolute 0.1 0.0 - 0.7 K/uL   Basophils Absolute 0.0 0.0 - 0.1 K/uL  HIV Antibody (routine testing w rflx)  Result Value Ref Range   HIV 1&2 Ab, 4th Generation NON-REACTIVE NON-REACTI  RPR  Result Value Ref Range   RPR Ser Ql NON-REACTIVE NON-REACTI      Assessment & Plan:   Problem List Items Addressed This Visit    Medical certificate issuance - Primary    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 Encouraged avoid added sugars in diet.           No orders of the defined types were placed in this encounter.  No orders of the defined types were placed in this encounter.   Follow up plan: Return as needed.  Eustaquio Boyden, MD

## 2018-05-22 NOTE — Assessment & Plan Note (Signed)
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

## 2018-05-22 NOTE — Patient Instructions (Addendum)
Try to get copy of immunizations for our records.  Forms filled out today. Return as needed. Good luck with job application!    Hearing Screening   125Hz  250Hz  500Hz  1000Hz  2000Hz  3000Hz  4000Hz  6000Hz  8000Hz   Right ear:   20 20 20  20     Left ear:   20 20 20  20       Visual Acuity Screening   Right eye Left eye Both eyes   Without correction: 20/15 20/13 20/13   With correction:          OPTIONS FOR DENTAL FOLLOW UP CARE  [place] Department of Health and Human Services - Local Safety Net Dental Clinics [email or web address]  [facility] [phone])  [facility] on Radiation protection practitioner in [place] [phone]) Discount dental care office with plans to help patient cover expenses. In addition, they also have walk - in emergency hours, patient can wait to be seen.   [facility] [phone] Select option 1 for emergencies   Location: [facility], [place], [address], [place] Clinic Hours: No walk-ins accepted - call the day before to schedule an appointment. Check in times are 9:30 am and 1:30 pm. Services: Simple extractions, temporary fillings, pulpectomy/pulp debridement, uncomplicated abscess drainage. Payment Options: PAYMENT IS DUE AT THE TIME OF SERVICE.  Fee is usually $100-200, additional surgical procedures (e.g. abscess drainage) may be extra. Cash, checks, Visa/MasterCard accepted.  Can file Medicaid if patient is covered for dental - patient should call case worker to check. No discount for [facility] patients. Best way to get seen: MUST call the day before and get onto the schedule. Can usually be seen the next 1-2 days. No walk-ins accepted.

## 2018-06-09 ENCOUNTER — Encounter: Payer: Self-pay | Admitting: Family Medicine

## 2018-06-09 NOTE — Telephone Encounter (Signed)
Filled out and in Lisa's box.  

## 2018-06-09 NOTE — Telephone Encounter (Signed)
Printed form.  Placed in Dr. G's box.  ?

## 2018-06-10 NOTE — Telephone Encounter (Signed)
Placed form at front office. Made copy to be scanned.  

## 2018-08-15 ENCOUNTER — Encounter: Payer: Self-pay | Admitting: Family Medicine

## 2018-08-26 ENCOUNTER — Encounter: Payer: Self-pay | Admitting: Family Medicine

## 2018-08-26 NOTE — Telephone Encounter (Deleted)
To PCP

## 2018-08-28 NOTE — Telephone Encounter (Signed)
Pt called to see if form is ready. Did let pt know the usual turn around time is up to a week. Pt needs it for tomorrow

## 2018-08-28 NOTE — Telephone Encounter (Signed)
Filled and in Lisa's box 

## 2018-08-28 NOTE — Telephone Encounter (Signed)
Received form and given to Dr. Reece Agar.

## 2018-08-28 NOTE — Telephone Encounter (Signed)
Spoke with pt notifying him the form is ready to pick up. Expresses his thanks and will try to have his mom get to the office before 5:00 today to pick it up.   [Placed form at front office.]

## 2018-12-14 ENCOUNTER — Encounter: Payer: Self-pay | Admitting: Family Medicine

## 2018-12-14 MED ORDER — FLUCONAZOLE 150 MG PO TABS: ORAL_TABLET | ORAL | 0 refills | Status: DC

## 2019-01-04 ENCOUNTER — Encounter: Payer: Self-pay | Admitting: Family Medicine

## 2019-01-04 NOTE — Telephone Encounter (Signed)
He needs to come in face to face for a full check-up on this and exam to see what is wrong.  I can't treat over email.

## 2019-01-07 ENCOUNTER — Ambulatory Visit: Payer: Self-pay | Admitting: Family Medicine

## 2019-01-13 ENCOUNTER — Other Ambulatory Visit: Payer: Self-pay

## 2019-01-13 ENCOUNTER — Ambulatory Visit: Payer: Self-pay | Admitting: Family Medicine

## 2019-01-13 ENCOUNTER — Encounter: Payer: Self-pay | Admitting: Family Medicine

## 2019-01-13 VITALS — BP 108/72 | HR 60 | Temp 98.6°F | Ht 66.75 in | Wt 231.5 lb

## 2019-01-13 DIAGNOSIS — R369 Urethral discharge, unspecified: Secondary | ICD-10-CM

## 2019-01-13 DIAGNOSIS — N481 Balanitis: Secondary | ICD-10-CM

## 2019-01-13 DIAGNOSIS — R360 Urethral discharge without blood: Secondary | ICD-10-CM

## 2019-01-13 MED ORDER — FLUCONAZOLE 150 MG PO TABS: ORAL_TABLET | ORAL | 0 refills | Status: DC

## 2019-01-13 MED ORDER — METRONIDAZOLE 500 MG PO TABS: 500.0000 mg | ORAL_TABLET | Freq: Three times a day (TID) | ORAL | 0 refills | Status: DC

## 2019-01-13 NOTE — Progress Notes (Signed)
Zachary Lodwick T. Cranford Blessinger, MD Primary Care and Sports Medicine [name] [address] [place], [id number] Phone: [phone]  FAX: [phone]  Zachary Simpson - 28 y.o. male  MRN [id number]  Date of Birth: 05-Feb-1991  Visit Date: 01/13/2019  PCP: Hannah Beatopland, Aryaa Bunting, MD  Referred by: Hannah Beatopland, Brittney Caraway, MD  Chief Complaint  Patient presents with  . Balanitis   Subjective:   Zachary Simpson is a 28 y.o. very pleasant male patient who presents with the following:  1 or 2 people. Years.  Pleasant young man.  He has had some recurrent balanitis.  I have given him some Diflucan, and that he did think that this helped quite a bit at first, but then he had some more changes in the scan around the base of his penis.  He is not had any discharge no STD known exposures, but he does have 2 sexual partners and these have been the same for the last 2 or 3 years.  No ulcers.  No pain.  Rarely.   Past Medical History, Surgical History, Social History, Family History, Problem List, Medications, and Allergies have been reviewed and updated if relevant.  Patient Active Problem List   Diagnosis Date Noted  . Medical certificate issuance 05/22/2018  . Hyperglycemia 05/22/2018  . Urethritis 04/10/2017  . 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 Ruptured appendicitis 08/06/2016  . EXTERNAL HEMORRHOIDS 03/02/2010    Past Medical History:  Diagnosis Date  . Appendicitis     Past Surgical History:  Procedure Laterality Date  . APPENDECTOMY      Social History   Socioeconomic History  . Marital status: Single    Spouse name: Not on file  . Number of children: Not on file  . Years of education: Not on file  . Highest education level: Not on file  Occupational History  . Occupation: other    Comment: [employer]  . Occupation: Office managersecurity  . 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 Financial resource strain: Not on file  . Food insecurity:   Worry: Not on file    Inability: Not on file  . Transportation needs:    Medical: Not on file    Non-medical: Not on file  Tobacco Use  . Smoking status: Former Games developermoker  . Smokeless tobacco: Never Used  Substance and Sexual Activity  . Alcohol use: No  . Drug use: No  . Sexual activity: Never  Lifestyle  . Physical activity:    Days per week: Not on file    Minutes per session: Not on file  . Stress: Not on file  Relationships  . Social connections:    Talks on phone: Not on file    Gets together: Not on file    Attends religious service: Not on file    Active member of club or organization: Not on file    Attends meetings of clubs or organizations: Not on file    Relationship status: Not on file  . Intimate partner violence:    Fear of current or ex partner: Not on file    Emotionally abused: Not on file    Physically abused: Not on file    Forced sexual activity: Not on file  Other Topics Concern  . Not on file  Social History Narrative   [place] [employer] [place] [employer] Wants to do online courses at [employer].    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 Hypertension Mother   . Stroke Maternal Grandfather     No Known Allergies  Medication list reviewed and updated in full in [place].   GEN: No acute illnesses, no fevers, chills. GI: No n/v/d, eating normally Pulm: No SOB Interactive and getting along well at home.  Otherwise, ROS is as per the HPI.  Objective:   BP 108/72   Pulse 60   Temp 98.6 F (37 C) (Oral)   Ht 5' 6.75" (1.695 m)   Wt 231 lb 8 oz (105 kg)   BMI 36.53 kg/m   GEN: WDWN, NAD, Non-toxic, A & O x 3 HEENT: Atraumatic, Normocephalic. Neck supple. No masses, No LAD. Ears and Nose: No external deformity. Classic, fairly macerated appearance behind the  glands with some irritation and inflammation. EXTR: No c/c/e NEURO Normal gait.  PSYCH: Normally interactive. Conversant. Not depressed or anxious appearing.  Calm demeanor.   Laboratory and Imaging Data:  Assessment and Plan:   Balanitis  Penile discharge, without blood  Chronic balanitis, and is also been quite sexually active throughout this time, and I think that this is more mechanical in nature now.  Abstain from intercourse for 14 days, and I will go ahead and treat him with Diflucan as well as Flagyl.  HD STD check  Follow-up: No follow-ups on file.  Meds ordered this encounter  Medications  . fluconazole (DIFLUCAN) 150 MG tablet    Sig: Every other day for 4 doses    Dispense:  4 tablet    Refill:  0  . metroNIDAZOLE (FLAGYL) 500 MG tablet    Sig: Take 1 tablet (500 mg total) by mouth 3 (three) times daily.    Dispense:  21 tablet    Refill:  0   No orders of the defined types were placed in this encounter.   Signed,  Maud Deed. Saran Laviolette, MD   Outpatient Encounter Medications as of 01/13/2019  Medication Sig  . fluconazole (DIFLUCAN) 150 MG tablet Every other day for 4 doses  . metroNIDAZOLE (FLAGYL) 500 MG tablet Take 1 tablet (500 mg total) by mouth 3 (three) times daily.  . [DISCONTINUED] fluconazole (DIFLUCAN) 150 MG tablet Take 1 tab po today, repeat in 7 days if needed   No facility-administered encounter medications on file as of 01/13/2019.

## 2019-06-13 ENCOUNTER — Telehealth: Payer: Self-pay | Admitting: Family

## 2019-06-13 DIAGNOSIS — R300 Dysuria: Secondary | ICD-10-CM

## 2019-06-13 DIAGNOSIS — R369 Urethral discharge, unspecified: Secondary | ICD-10-CM

## 2019-06-13 NOTE — Progress Notes (Signed)
Based on what you shared with me, I feel your condition warrants further evaluation and I recommend that you be seen for a face to face office visit.  Given your symptoms, you need to be seen face to face to rule out a STD.   NOTE: If you entered your credit card information for this eVisit, you will not be charged. You may see a "hold" on your card for the $35 but that hold will drop off and you will not have a charge processed.  If you are having a true medical emergency please call 911.     For an urgent face to face visit, [facility] has four urgent care centers for your convenience:    NEW:  [facility]   https://www.google.com/maps/dir/?[email or web address]&destination=[facility]+at+[place]%2c+[address]+[place]+[address]                                                   [place], [place] [address] .  Monday - Friday 10 am - 6 pm    . [facility]    [phone]                  Get Driving Directions  [address] [place], [place] [address] . 10 am to 8 pm Monday-Friday . 12 pm to 8 pm Saturday-Sunday   . [facility] at [place]                  Get Driving Directions  [address], [place], [place] [address] . 8 am to 8 pm Monday-Friday . 9 am to 6 pm Saturday . 11 am to 6 pm Sunday     . [facility] at [place]                  Get Driving Directions   [address] [place], [place] [address] . 8 am to 8 pm Monday-Friday . 8 am to 4 pm Saturday-Sunday    . [facility] at [place]                    Get Driving Directions  [phone]  [address], [place], [place] [address]  . Monday-Friday, 12 PM to 6 PM    Your e-visit answers were reviewed by a board certified advanced clinical practitioner to complete your  personal care plan.  Thank you for using e-Visits.

## 2019-06-14 ENCOUNTER — Encounter: Payer: Self-pay | Admitting: Family Medicine

## 2019-06-16 ENCOUNTER — Other Ambulatory Visit: Payer: Self-pay

## 2019-06-16 ENCOUNTER — Encounter: Payer: Self-pay | Admitting: Family Medicine

## 2019-06-16 ENCOUNTER — Ambulatory Visit (INDEPENDENT_AMBULATORY_CARE_PROVIDER_SITE_OTHER): Payer: Self-pay | Admitting: Family Medicine

## 2019-06-16 VITALS — BP 128/80 | HR 70 | Temp 98.7°F | Ht 66.75 in | Wt 220.2 lb

## 2019-06-16 DIAGNOSIS — N485 Ulcer of penis: Secondary | ICD-10-CM

## 2019-06-16 DIAGNOSIS — Z7721 Contact with and (suspected) exposure to potentially hazardous body fluids: Secondary | ICD-10-CM

## 2019-06-16 DIAGNOSIS — R360 Urethral discharge without blood: Secondary | ICD-10-CM

## 2019-06-16 MED ORDER — VALACYCLOVIR HCL 1 G PO TABS: 1000.0000 mg | ORAL_TABLET | Freq: Two times a day (BID) | ORAL | 0 refills | Status: DC

## 2019-06-16 NOTE — Addendum Note (Signed)
Addended by: Ellamae Sia on: 06/16/2019 09:47 AM   Modules accepted: Orders

## 2019-06-16 NOTE — Addendum Note (Signed)
Addended by: Ellamae Sia on: 06/16/2019 09:43 AM   Modules accepted: Orders

## 2019-06-16 NOTE — Progress Notes (Signed)
Aitana Burry T. Fabiana Dromgoole, MD Primary Care and Sports Medicine [facility] [address] [place], [address] Phone: [phone]  FAX: [phone]  Zachary Simpson - 28 y.o. male  MRN [id number]  Date of Birth: Mar 29, 1991  Visit Date: 06/16/2019  PCP: Hannah Beat, MD  Referred by: Hannah Beat, MD  Chief Complaint  Patient presents with  . STD Check   Subjective:   Zachary Simpson is a 28 y.o. very pleasant male patient who presents with the following:  Girls about four the last year, rare condoms.   He is a nice guy.  He has had about 4 different sexual partners in the last year.  He rarely wears condoms.  He has had some penile discharge.  He also has a small ulcer just proximal to the corona of the penis.  No visible warts or anything.  Past Medical History, Surgical History, Social History, Family History, Problem List, Medications, and Allergies have been reviewed and updated if relevant.  Patient Active Problem List   Diagnosis Date Noted  . Medical certificate issuance 05/22/2018  . Hyperglycemia 05/22/2018  . Urethritis 04/10/2017  . 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 Ruptured appendicitis 08/06/2016  . EXTERNAL HEMORRHOIDS 03/02/2010    Past Medical History:  Diagnosis Date  . Appendicitis     Past Surgical History:  Procedure Laterality Date  . APPENDECTOMY      Social History   Socioeconomic History  . Marital status: Single    Spouse name: Not on file  . Number of children: Not on file  . Years of education: Not on file  . Highest education level: Not on file  Occupational History  . Occupation: other    Comment: Museum/gallery curator  . Occupation: Office manager  . Occupation: Designer, jewellery  . Financial resource strain: Not on file  . Food insecurity    Worry: Not on file    Inability: Not on file  . Transportation needs    Medical: Not on file    Non-medical: Not on file  Tobacco Use   . Smoking status: Former Games developer  . Smokeless tobacco: Never Used  Substance and Sexual Activity  . Alcohol use: No  . Drug use: No  . Sexual activity: Never  Lifestyle  . Physical activity    Days per week: Not on file    Minutes per session: Not on file  . Stress: Not on file  Relationships  . Social Musician on phone: Not on file    Gets together: Not on file    Attends religious service: Not on file    Active member of club or organization: Not on file    Attends meetings of clubs or organizations: Not on file    Relationship status: Not on file  . Intimate partner violence    Fear of current or ex partner: Not on file    Emotionally abused: Not on file    Physically abused: Not on file    Forced sexual activity: Not on file  Other Topics Concern  . Not on file  Social History Narrative   Went to [employer] at [employer], then [place]. Wants to do online courses at [employer].    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 Hypertension Mother   . Stroke Maternal Grandfather     No Known Allergies  Medication list reviewed and updated in full in [place].   GEN: No acute illnesses, no fevers, chills. GI: No n/v/d, eating normally Pulm: No SOB Interactive and getting along well at home.  Otherwise, ROS is as per the HPI.  Objective:   BP 128/80   Pulse 70   Temp 98.7 F (37.1 C) (Temporal)   Ht 5' 6.75" (1.695 m)   Wt 220 lb 4 oz (99.9 kg)   SpO2 98%   BMI 34.75 kg/m   GEN: WDWN, NAD, Non-toxic, A & O x 3 HEENT: Atraumatic, Normocephalic. Neck supple. No masses, No LAD. Ears and Nose: No external deformity. GU: The shaft of the penis is unremarkable.  No expressible pus.  At the base of the penis there is a approximately 3 to 4 mm ulceration.  No expressible pus at all.  No unusual  sensation or masses in the testicles. EXTR: No c/c/e NEURO Normal gait.  PSYCH: Normally interactive. Conversant. Not depressed or anxious appearing.  Calm demeanor.   Laboratory and Imaging Data:  Assessment and Plan:     ICD-10-CM   1. Penile discharge, without blood  R36.0 HSV Type I/II IgG, IgMw/ reflex    Chlamydia / Gonorrhea    HIV Antibody ( Reflex)    RPR    Trichomonas RNA  2. Penile ulcer  N48.5 HSV Type I/II IgG, IgMw/ reflex    Chlamydia / Gonorrhea    HIV Antibody ( Reflex)    RPR    Trichomonas RNA  3. History of exposure to hazardous bodily fluids  Z77.21 HSV Type I/II IgG, IgMw/ reflex    Chlamydia / Gonorrhea    HIV Antibody ( Reflex)    RPR    Trichomonas RNA   Exposure to body fluids with high level of concern for potential STDs.  Follow-up: No follow-ups on file.  Meds ordered this encounter  Medications  . valACYclovir (VALTREX) 1000 MG tablet    Sig: Take 1 tablet (1,000 mg total) by mouth 2 (two) times daily.    Dispense:  14 tablet    Refill:  0   Orders Placed This Encounter  Procedures  . Chlamydia / Gonorrhea  . Trichomonas RNA  . HSV Type I/II IgG, IgMw/ reflex  . HIV Antibody ( Reflex)  . RPR    Signed,  Frederico Hamman T. Catha Ontko, MD   Outpatient Encounter Medications as of 06/16/2019  Medication Sig  . valACYclovir (VALTREX) 1000 MG tablet Take 1 tablet (1,000 mg total) by mouth 2 (two) times daily.  . [DISCONTINUED] fluconazole (DIFLUCAN) 150 MG tablet Every other day for 4 doses  . [DISCONTINUED] metroNIDAZOLE (FLAGYL) 500 MG tablet Take 1 tablet (500 mg total) by mouth 3 (three) times daily.   No facility-administered encounter medications on file as of 06/16/2019.

## 2019-06-17 ENCOUNTER — Encounter: Payer: Self-pay | Admitting: Family Medicine

## 2019-06-17 LAB — HSV(HERPES SIMPLEX VRS) I + II AB-IGG
HAV 1 IGG,TYPE SPECIFIC AB: 30.2 index — ABNORMAL HIGH
HSV 2 IGG,TYPE SPECIFIC AB: <0.9 index

## 2019-06-17 MED ORDER — AZITHROMYCIN 500 MG PO TABS: 1000.0000 mg | ORAL_TABLET | Freq: Once | ORAL | 0 refills | Status: AC

## 2019-06-17 NOTE — Addendum Note (Signed)
Addended by: Carter Kitten on: 06/17/2019 12:34 PM   Modules accepted: Orders

## 2019-06-17 NOTE — Telephone Encounter (Signed)
Dr. Lorelei Pont,  Please call patient.  He has questions I can not answer.

## 2019-06-19 LAB — C. TRACHOMATIS/N. GONORRHOEAE RNA
C. trachomatis RNA, TMA: DETECTED — AB
N. gonorrhoeae RNA, TMA: NOT DETECTED

## 2019-06-19 LAB — HSV 1/2 AB (IGM), IFA W/RFLX TITER
HSV 1 IgM Screen: POSITIVE — AB
HSV 2 IgM Screen: NEGATIVE

## 2019-06-19 LAB — RPR: RPR Ser Ql: NONREACTIVE

## 2019-06-19 LAB — HSV 1 IGM, IFA (REFLEX): HSV 1 IGM TITER: 1:20 {titer} — AB

## 2019-06-19 LAB — HIV ANTIBODY (ROUTINE TESTING W REFLEX): HIV 1&2 Ab, 4th Generation: NONREACTIVE

## 2019-06-19 LAB — TRICHOMONAS VAGINALIS RNA, QL,MALES: Trichomonas vaginalis RNA: NOT DETECTED

## 2019-06-19 NOTE — Telephone Encounter (Signed)
Reviewed all on the phone

## 2020-10-18 ENCOUNTER — Other Ambulatory Visit: Payer: Self-pay | Admitting: Family Medicine

## 2020-10-18 MED ORDER — VALACYCLOVIR HCL 1 G PO TABS: 1000.0000 mg | ORAL_TABLET | Freq: Two times a day (BID) | ORAL | 0 refills | Status: DC

## 2020-12-14 NOTE — Telephone Encounter (Signed)
Forms printed and placed in Dr. Cyndie Chime office in box.

## 2020-12-14 NOTE — Telephone Encounter (Signed)
Pt called to see if could get shaving profile done today; I asked pt if he had seen the pt message response from Dr Patsy Lager and I reviewed with pt and pt voiced understanding. Pt wanted to know if Dr Patsy Lager was in office so he could speak with him. Pt will pt message Dr Patsy Lager again.

## 2020-12-19 ENCOUNTER — Encounter: Payer: Self-pay | Admitting: Family Medicine

## 2020-12-20 ENCOUNTER — Ambulatory Visit (INDEPENDENT_AMBULATORY_CARE_PROVIDER_SITE_OTHER): Payer: Self-pay | Admitting: Family Medicine

## 2020-12-20 ENCOUNTER — Other Ambulatory Visit: Payer: Self-pay

## 2020-12-20 ENCOUNTER — Encounter: Payer: Self-pay | Admitting: Family Medicine

## 2020-12-20 VITALS — BP 120/84 | HR 80 | Temp 97.4°F | Ht 66.75 in | Wt 207.0 lb

## 2020-12-20 DIAGNOSIS — Z13220 Encounter for screening for lipoid disorders: Secondary | ICD-10-CM

## 2020-12-20 DIAGNOSIS — R5383 Other fatigue: Secondary | ICD-10-CM

## 2020-12-20 DIAGNOSIS — L731 Pseudofolliculitis barbae: Secondary | ICD-10-CM

## 2020-12-20 DIAGNOSIS — D573 Sickle-cell trait: Secondary | ICD-10-CM

## 2020-12-20 DIAGNOSIS — Z131 Encounter for screening for diabetes mellitus: Secondary | ICD-10-CM

## 2020-12-20 HISTORY — DX: Sickle-cell trait: D57.3

## 2020-12-20 NOTE — Progress Notes (Signed)
Zachary Simpson T. Zachary Thai, MD, CAQ Sports Medicine  Primary Care and Sports Medicine [facility] at [facility] [address] [place], [address]  Phone: [phone]  FAX: [phone]  Zachary Simpson - 30 y.o. male  MRN [id number]  Date of Birth: 05-Sep-1990  Date: 12/20/2020  PCP: Hannah Beat, MD  Referral: Hannah Beat, MD  Chief Complaint  Patient presents with  . no shave profile permanent    This visit occurred during the SARS-CoV-2 public health emergency.  Safety protocols were in place, including screening questions prior to the visit, additional usage of staff PPE, and extensive cleaning of exam room while observing appropriate contact time as indicated for disinfecting solutions.   Subjective:   Zachary Simpson is a 30 y.o. very pleasant male patient with Body mass index is 32.66 kg/m. 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  Globally, he is really doing pretty well without any significant complaints.  Sickle cell trait  Tdap through the military HIV testing -all through the military  HSV breakouts once every two months.  Generally they resolve very quickly  Same male partner for years.  No STD concerns.  Wt Readings from Last 3 Encounters:  12/20/20 207 lb (93.9 kg)  06/16/19 220 lb 4 oz (99.9 kg)  01/13/19 231 lb 8 oz (105 kg)     Immunization History  Administered Date(s) Administered  . Influenza,inj,Quad PF,6+ Mos 05/06/2019  . PFIZER(Purple Top)SARS-COV-2 Vaccination 11/03/2020, 12/04/2020     Review of Systems is noted in the HPI, as appropriate  Objective:   BP 120/84   Pulse 80   Temp (!) 97.4 F (36.3 C) (Temporal)   Ht 5' 6.75" (1.695 m)   Wt 207 lb (93.9 kg)   SpO2 96%   BMI 32.66 kg/m   GEN: No acute distress;  alert,appropriate. PULM: Breathing comfortably in no respiratory distress PSYCH: Normally interactive.  CV: RRR, no m/g/r   Laboratory and Imaging Data:  Assessment and Plan:     ICD-10-CM   1. Pseudofolliculitis barbae  L73.1   2. Screening, lipid  Z13.220 Lipid panel  3. Screening for diabetes mellitus  Z13.1 Basic metabolic panel    Hemoglobin A1c  4. Other fatigue  R53.83 CBC with Differential/Platelet    Hepatic function panel    VITAMIN D 25 Hydroxy (Vit-D Deficiency, Fractures)    Vitamin B12  5. Sickle cell trait (HCC)  D57.3    Total encounter time: 10 minutes. This includes total time spent on the day of encounter.  I filled out the necessary forms for him for recurrent pseudofolliculitis barbae.  Were also going to check some basic labs for him today in the office. 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  Orders Placed This Encounter  Procedures  . Lipid panel  . Basic metabolic panel  . CBC with Differential/Platelet  . Hepatic function panel  . Hemoglobin A1c  . VITAMIN D 25 Hydroxy (Vit-D Deficiency, Fractures)  . Vitamin B12    Follow-up: prn  Signed,  Bekah Igoe T. Griffith Santilli, MD   Outpatient Encounter Medications as of 12/20/2020  Medication Sig  . valACYclovir (VALTREX) 1000 MG tablet Take 1 tablet (1,000 mg total) by mouth 2 (two) times daily.   No facility-administered encounter medications on file as of 12/20/2020.

## 2020-12-20 NOTE — Addendum Note (Signed)
Addended by: Alvina Chou on: 12/20/2020 04:38 PM   Modules accepted: Orders

## 2020-12-21 LAB — CBC WITH DIFFERENTIAL/PLATELET
Absolute Monocytes: 372 cells/uL (ref 200–950)
Basophils Absolute: 20 cells/uL (ref 0–200)
Basophils Relative: 0.4 %
Eosinophils Absolute: 219 cells/uL (ref 15–500)
Eosinophils Relative: 4.3 %
HCT: 48.9 % (ref 38.5–50.0)
Hemoglobin: 16.4 g/dL (ref 13.2–17.1)
Lymphs Abs: 1918 cells/uL (ref 850–3900)
MCH: 26.8 pg — ABNORMAL LOW (ref 27.0–33.0)
MCHC: 33.5 g/dL (ref 32.0–36.0)
MCV: 79.9 fL — ABNORMAL LOW (ref 80.0–100.0)
MPV: 10.8 fL (ref 7.5–12.5)
Monocytes Relative: 7.3 %
Neutro Abs: 2570 cells/uL (ref 1500–7800)
Neutrophils Relative %: 50.4 %
Platelets: 231 10*3/uL (ref 140–400)
RBC: 6.12 10*6/uL — ABNORMAL HIGH (ref 4.20–5.80)
RDW: 13.8 % (ref 11.0–15.0)
Total Lymphocyte: 37.6 %
WBC: 5.1 10*3/uL (ref 3.8–10.8)

## 2020-12-21 LAB — BASIC METABOLIC PANEL
BUN: 13 mg/dL (ref 7–25)
CO2: 23 mmol/L (ref 20–32)
Calcium: 9.4 mg/dL (ref 8.6–10.3)
Chloride: 103 mmol/L (ref 98–110)
Creat: 1.13 mg/dL (ref 0.60–1.35)
Glucose, Bld: 97 mg/dL (ref 65–99)
Potassium: 4.4 mmol/L (ref 3.5–5.3)
Sodium: 140 mmol/L (ref 135–146)

## 2020-12-21 LAB — HEPATIC FUNCTION PANEL
AG Ratio: 1.2 (calc) (ref 1.0–2.5)
ALT: 11 U/L (ref 9–46)
AST: 15 U/L (ref 10–40)
Albumin: 4.1 g/dL (ref 3.6–5.1)
Alkaline phosphatase (APISO): 78 U/L (ref 36–130)
Bilirubin, Direct: 0.1 mg/dL (ref 0.0–0.2)
Globulin: 3.3 g/dL (calc) (ref 1.9–3.7)
Indirect Bilirubin: 0.3 mg/dL (calc) (ref 0.2–1.2)
Total Bilirubin: 0.4 mg/dL (ref 0.2–1.2)
Total Protein: 7.4 g/dL (ref 6.1–8.1)

## 2020-12-21 LAB — LIPID PANEL
Cholesterol: 194 mg/dL (ref <200)
HDL: 57 mg/dL (ref >=40)
LDL Cholesterol (Calc): 111 mg/dL (calc) — ABNORMAL HIGH
Non-HDL Cholesterol (Calc): 137 mg/dL (calc) — ABNORMAL HIGH (ref <130)
Total CHOL/HDL Ratio: 3.4 (calc) (ref <5.0)
Triglycerides: 144 mg/dL (ref <150)

## 2020-12-21 LAB — HEMOGLOBIN A1C
Hgb A1c MFr Bld: 5.4 % of total Hgb (ref <5.7)
Mean Plasma Glucose: 108 mg/dL
eAG (mmol/L): 6 mmol/L

## 2020-12-21 LAB — VITAMIN B12: Vitamin B-12: 626 pg/mL (ref 200–1100)

## 2020-12-21 LAB — VITAMIN D 25 HYDROXY (VIT D DEFICIENCY, FRACTURES): Vit D, 25-Hydroxy: 12 ng/mL — ABNORMAL LOW (ref 30–100)

## 2021-03-04 NOTE — Telephone Encounter (Signed)
Please make him an appointment to evaluate this problem.  (Burns with urination and discharge)

## 2021-03-06 NOTE — Telephone Encounter (Signed)
Left voice message to call the office  

## 2021-03-07 ENCOUNTER — Other Ambulatory Visit: Payer: Self-pay

## 2021-03-07 ENCOUNTER — Encounter: Payer: Self-pay | Admitting: Family Medicine

## 2021-03-07 ENCOUNTER — Ambulatory Visit (INDEPENDENT_AMBULATORY_CARE_PROVIDER_SITE_OTHER): Payer: Self-pay | Admitting: Family Medicine

## 2021-03-07 VITALS — BP 90/60 | HR 64 | Temp 97.8°F | Ht 66.75 in | Wt 220.0 lb

## 2021-03-07 DIAGNOSIS — A549 Gonococcal infection, unspecified: Secondary | ICD-10-CM

## 2021-03-07 DIAGNOSIS — R360 Urethral discharge without blood: Secondary | ICD-10-CM

## 2021-03-07 DIAGNOSIS — Z9189 Other specified personal risk factors, not elsewhere classified: Secondary | ICD-10-CM

## 2021-03-07 DIAGNOSIS — A6000 Herpesviral infection of urogenital system, unspecified: Secondary | ICD-10-CM | POA: Insufficient documentation

## 2021-03-07 HISTORY — DX: Herpesviral infection of urogenital system, unspecified: A60.00

## 2021-03-07 NOTE — Progress Notes (Addendum)
Zachary Simpson T. [name], MD, CAQ Sports Medicine [facility] at [facility] [address] [place], [address]  Phone: [phone]  FAX: [phone]  Zachary Simpson - 30 y.o. male  MRN [id number]  Date of Birth: 04-14-1991  Date: 03/07/2021  PCP: Hannah Beat, MD  Referral: Hannah Beat, MD  Chief Complaint  Patient presents with   Penile Discharge    This visit occurred during the SARS-CoV-2 public health emergency.  Safety protocols were in place, including screening questions prior to the visit, additional usage of staff PPE, and extensive cleaning of exam room while observing appropriate contact time as indicated for disinfecting solutions.   Subjective:   Zachary Simpson is a 30 y.o. very pleasant male patient with Body mass index is 34.72 kg/m. who presents with the following:  Penile discharge and painful urination. Girl with about eight months.  Last week, urine started to burn.  Very obvious discharge. He has never had anything like this before.  He has been monogamous with his girlfriend and has not been with anybody else in the last 8 months.  Not an outbreak. Boxer linging has some discharge.    Noticed a discharge at the end of his penis.  Urethritis.   He does not have any open sores, but he does have HSV.    Review of Systems is noted in the HPI, as appropriate  Objective:   BP 90/60   Pulse 64   Temp 97.8 F (36.6 C) (Temporal)   Ht 5' 6.75" (1.695 m)   Wt 220 lb (99.8 kg)   SpO2 97%   BMI 34.72 kg/m   GEN: No acute distress; alert,appropriate. PULM: Breathing comfortably in no respiratory distress PSYCH: Normally interactive.   GU: normal phallus with obvious penile disharge.  No ulcers are present.  No other skin change or warts.  Laboratory and Imaging Data:  Assessment and Plan:     ICD-10-CM   1. Penile discharge, without blood  R36.0 HIV Antibody (routine testing w rflx)    C. trachomatis/N.  gonorrhoeae RNA    Trichomonas vaginalis, RNA    RPR    Hepatitis C antibody    Trichomonas vaginalis RNA, Ql,Males    2. At risk for sexually transmitted disease due to unprotected sex  Z91.89 HIV Antibody (routine testing w rflx)    C. trachomatis/N. gonorrhoeae RNA    Trichomonas vaginalis, RNA    RPR    Hepatitis C antibody    Trichomonas vaginalis RNA, Ql,Males    3. Gonorrhea  A54.9      Obvious penile discharge on exam.  Painful urination as well, concerning for urethritis.  STD check.  Treat based on results. We counseled about this is always or virtually always from sexual encounters.  Addendum: 03/08/21 10:44 AM  Addendum.  At this time, testing is positive for gonorrhea. Will have nursing arrange for a Rocephin 1 gram IM injection. I will also cover with doxy, as well. Electronically Signed  By: Hannah Beat, MD On: 03/08/2021 10:44 AM   Orders Placed This Encounter  Procedures   C. trachomatis/N. gonorrhoeae RNA   Trichomonas vaginalis, RNA   HIV Antibody (routine testing w rflx)   RPR   Hepatitis C antibody    Signed,  Taliya Mcclard T. Octa Uplinger, MD   Outpatient Encounter Medications as of 03/07/2021  Medication Sig   doxycycline (VIBRA-TABS) 100 MG tablet Take 1 tablet (100 mg total) by mouth 2 (two) times daily.   valACYclovir (VALTREX)  1000 MG tablet Take 1 tablet (1,000 mg total) by mouth 2 (two) times daily.   No facility-administered encounter medications on file as of 03/07/2021.

## 2021-03-08 LAB — HIV ANTIBODY (ROUTINE TESTING W REFLEX): HIV 1&2 Ab, 4th Generation: NONREACTIVE

## 2021-03-08 LAB — HEPATITIS C ANTIBODY
Hepatitis C Ab: NONREACTIVE
SIGNAL TO CUT-OFF: 0.01 (ref <1.00)

## 2021-03-08 LAB — C. TRACHOMATIS/N. GONORRHOEAE RNA
C. trachomatis RNA, TMA: NOT DETECTED
N. gonorrhoeae RNA, TMA: DETECTED — AB

## 2021-03-08 LAB — TRICHOMONAS VAGINALIS RNA, QL,MALES: Trichomonas vaginalis RNA: NOT DETECTED

## 2021-03-08 LAB — RPR: RPR Ser Ql: NONREACTIVE

## 2021-03-08 MED ORDER — DOXYCYCLINE HYCLATE 100 MG PO TABS: 100.0000 mg | ORAL_TABLET | Freq: Two times a day (BID) | ORAL | 0 refills | Status: DC

## 2021-03-08 NOTE — Addendum Note (Signed)
Addended by: Hannah Beat on: 03/08/2021 10:44 AM   Modules accepted: Orders

## 2021-03-09 ENCOUNTER — Ambulatory Visit: Payer: Self-pay

## 2021-03-15 ENCOUNTER — Telehealth: Payer: Self-pay

## 2021-03-15 ENCOUNTER — Ambulatory Visit: Payer: Self-pay

## 2021-03-15 NOTE — Telephone Encounter (Signed)
Left message for pt to call office. Dr Patsy Lager noticed he had rescheduled his NV from today for a week later. He wants to make sure the pt is aware that the current oral antibiotic he is taking will not treat the infection the Rocephin injectable antibiotic is for. Wants to make sure he keeps the appt 03-22-21.

## 2021-03-22 ENCOUNTER — Other Ambulatory Visit: Payer: Self-pay

## 2021-03-22 ENCOUNTER — Ambulatory Visit (INDEPENDENT_AMBULATORY_CARE_PROVIDER_SITE_OTHER): Payer: Self-pay | Admitting: *Deleted

## 2021-03-22 DIAGNOSIS — A549 Gonococcal infection, unspecified: Secondary | ICD-10-CM

## 2021-03-22 MED ORDER — CEFTRIAXONE SODIUM 1 G IJ SOLR
1.0000 g | Freq: Once | INTRAMUSCULAR | Status: AC
  Administered 2021-03-22: 1 g via INTRAMUSCULAR

## 2021-03-22 NOTE — Progress Notes (Signed)
Per orders of Dr. Ermalene Searing, injection of Rocephin 1g given by Shon Millet. Patient tolerated injection well.   PCP not in office today will send to Dr. Ermalene Searing

## 2021-04-17 NOTE — Telephone Encounter (Signed)
Lvm to call back

## 2021-05-15 ENCOUNTER — Other Ambulatory Visit: Payer: Self-pay

## 2021-05-15 ENCOUNTER — Ambulatory Visit
Admission: EM | Admit: 2021-05-15 | Discharge: 2021-05-15 | Disposition: A | Discharge status: Home / Self Care | Payer: Self-pay | Attending: Physician Assistant | Admitting: Physician Assistant

## 2021-05-15 DIAGNOSIS — J069 Acute upper respiratory infection, unspecified: Secondary | ICD-10-CM

## 2021-05-15 DIAGNOSIS — R509 Fever, unspecified: Secondary | ICD-10-CM

## 2021-05-15 MED ORDER — ACETAMINOPHEN 325 MG PO TABS
975.0000 mg | ORAL_TABLET | Freq: Once | ORAL | Status: AC
Start: 2021-05-15 — End: 2021-05-15
  Administered 2021-05-15: 975 mg via ORAL

## 2021-05-15 NOTE — ED Triage Notes (Signed)
Pt c/o fatigue, malaise, cough, headache, chills, fever. Denies nausea, vomiting, diarrhea, constipation. Onset yesterday.

## 2021-05-15 NOTE — ED Provider Notes (Signed)
[facility]    CSN: [id number] Arrival date & time: 05/15/21  1027      History   Chief Complaint Chief Complaint  Patient presents with   Cough    HPI Zachary Simpson is a 30 y.o. male.   Patient here today for evaluation of fever, nasal congestion, cough and fatigue that started yesterday.  He has not checked his temperature at home.  He has not had any nausea, vomiting, diarrhea.  He does not report any treatment for symptoms.  The history is provided by the patient.  Cough Associated symptoms: chills, fever and sore throat   Associated symptoms: no ear pain, no eye discharge and no shortness of breath    Past Medical History:  Diagnosis Date   Appendicitis    Genital herpes 03/07/2021   Pseudofolliculitis barbae, recurrent    Sickle cell trait (HCC) 12/20/2020    Patient Active Problem List   Diagnosis Date Noted   Genital herpes 03/07/2021   Sickle cell trait (HCC) 12/20/2020   Pseudofolliculitis barbae 02/19/2017   Ruptured appendicitis 08/06/2016   EXTERNAL HEMORRHOIDS 03/02/2010    Past Surgical History:  Procedure Laterality Date   APPENDECTOMY         Home Medications    Prior to Admission medications   Medication Sig Start Date End Date Taking? Authorizing Provider  doxycycline (VIBRA-TABS) 100 MG tablet Take 1 tablet (100 mg total) by mouth 2 (two) times daily. 03/08/21   Copland, Karleen Hampshire, MD  valACYclovir (VALTREX) 1000 MG tablet Take 1 tablet (1,000 mg total) by mouth 2 (two) times daily. 10/18/20   Excell Seltzer, MD    Family History Family History  Problem Relation Age of Onset   Hypertension Mother    Stroke Maternal Grandfather     Social History Social History   Tobacco Use   Smoking status: Former   Smokeless tobacco: Never  Substance Use Topics   Alcohol use: No   Drug use: No     Allergies   Patient has no known allergies.   Review of Systems Review of Systems  Constitutional:  Positive for chills and  fever.  HENT:  Positive for congestion and sore throat. Negative for ear pain.   Eyes:  Negative for discharge and redness.  Respiratory:  Positive for cough. Negative for shortness of breath.   Gastrointestinal:  Negative for abdominal pain, nausea and vomiting.    Physical Exam Triage Vital Signs ED Triage Vitals  Enc Vitals Group     BP 05/15/21 1107 (!) 142/97     Pulse Rate 05/15/21 1107 93     Resp 05/15/21 1107 18     Temp 05/15/21 1107 (!) 101.3 F (38.5 C)     Temp Source 05/15/21 1107 Oral     SpO2 05/15/21 1107 97 %     Weight --      Height --      Head Circumference --      Peak Flow --      Pain Score 05/15/21 1108 0     Pain Loc --      Pain Edu? --      Excl. in GC? --    No data found.  Updated Vital Signs BP (!) 142/97 (BP Location: Left Arm)   Pulse 93   Temp (!) 101.3 F (38.5 C) (Oral)   Resp 18   SpO2 97%      Physical Exam Vitals and nursing note reviewed.  Constitutional:  General: He is not in acute distress.    Appearance: Normal appearance. He is not ill-appearing.  HENT:     Head: Normocephalic and atraumatic.     Right Ear: Tympanic membrane normal.     Left Ear: Tympanic membrane normal.     Nose: Congestion present.     Mouth/Throat:     Mouth: Mucous membranes are moist.     Pharynx: Oropharynx is clear. No oropharyngeal exudate or posterior oropharyngeal erythema.  Eyes:     Conjunctiva/sclera: Conjunctivae normal.  Cardiovascular:     Rate and Rhythm: Normal rate and regular rhythm.     Heart sounds: Normal heart sounds. No murmur heard. Pulmonary:     Effort: Pulmonary effort is normal. No respiratory distress.     Breath sounds: Normal breath sounds. No wheezing, rhonchi or rales.  Skin:    General: Skin is warm and dry.  Neurological:     Mental Status: He is alert.  Psychiatric:        Mood and Affect: Mood normal.        Thought Content: Thought content normal.     UC Treatments / Results  Labs (all labs  ordered are listed, but only abnormal results are displayed) Labs Reviewed  COVID-19, FLU A+B NAA    EKG   Radiology No results found.  Procedures Procedures (including critical care time)  Medications Ordered in UC Medications  acetaminophen (TYLENOL) tablet 975 mg (975 mg Oral Given 05/15/21 1135)    Initial Impression / Assessment and Plan / UC Course  I have reviewed the triage vital signs and the nursing notes.  Pertinent labs & imaging results that were available during my care of the patient were reviewed by me and considered in my medical decision making (see chart for details).  Suspect likely viral etiology of symptoms.  Will order COVID and flu screening.  Tylenol administered in office for fever.  Encouraged follow-up with any further concerns.  Final Clinical Impressions(s) / UC Diagnoses   Final diagnoses:  Fever, unspecified  Acute upper respiratory infection   Discharge Instructions   None    ED Prescriptions   None    PDMP not reviewed this encounter.   Tomi Bamberger, PA-C 05/15/21 1207

## 2021-05-17 LAB — COVID-19, FLU A+B NAA
Influenza A, NAA: DETECTED — AB
Influenza B, NAA: NOT DETECTED
SARS-CoV-2, NAA: NOT DETECTED

## 2022-08-30 ENCOUNTER — Telehealth: Payer: Self-pay

## 2022-08-30 NOTE — Telephone Encounter (Signed)
Transition Care Management Unsuccessful Follow-up Telephone Call  Date of discharge and from where:  08/29/22 [facility]  Attempts:  1st Attempt  Reason for unsuccessful TCM follow-up call:  Unable to leave message

## 2022-09-03 NOTE — Telephone Encounter (Signed)
Transition Care Management Unsuccessful Follow-up Telephone Call  Date of discharge and from where:  08/29/22 [facility]  Attempts:  3rd Attempt  Reason for unsuccessful TCM follow-up call:  Left voice message

## 2023-01-17 ENCOUNTER — Telehealth: Payer: Self-pay

## 2023-01-17 NOTE — Transitions of Care (Post Inpatient/ED Visit) (Signed)
Unable to reach pt by phone left v/m requesting pt to [phone].  01/17/2023  Name: Zachary Simpson MRN: [id number] DOB: 1991-05-23  Today's TOC FU Call Status: Today's TOC FU Call Status:: Unsuccessul Call (1st Attempt) Unsuccessful Call (1st Attempt) Date: 01/17/23  Attempted to reach the patient regarding the most recent Inpatient/ED visit.  Follow Up Plan: Additional outreach attempts will be made to reach the patient to complete the Transitions of Care (Post Inpatient/ED visit) call.   Signature Lewanda Rife, LPN

## 2023-01-20 NOTE — Transitions of Care (Post Inpatient/ED Visit) (Signed)
   01/20/2023  Name: Holton Hemani MRN: [id number] DOB: 1991/06/04  Today's TOC FU Call Status: Today's TOC FU Call Status:: Unsuccessful Call (2nd Attempt) Unsuccessful Call (1st Attempt) Date: 01/17/23 Unsuccessful Call (2nd Attempt) Date: 01/20/23  Attempted to reach the patient regarding the most recent Inpatient/ED visit.  Follow Up Plan: Additional outreach attempts will be made to reach the patient to complete the Transitions of Care (Post Inpatient/ED visit) call.   Signature Donnamarie Poag, CMA

## 2023-10-22 ENCOUNTER — Telehealth: Payer: Self-pay

## 2023-10-22 NOTE — Transitions of Care (Post Inpatient/ED Visit) (Signed)
 Unable to reach patient by phone and left v/m requesting call back at [phone].         10/22/2023  Name: Zachary Simpson MRN: [id number] DOB: April 17, 1991  Today's TOC FU Call Status: Today's TOC FU Call Status:: Unsuccessful Call (2nd Attempt) Unsuccessful Call (1st Attempt) Date: 10/22/23  Attempted to reach the patient regarding the most recent Inpatient/ED visit.  Follow Up Plan: Additional outreach attempts will be made to reach the patient to complete the Transitions of Care (Post Inpatient/ED visit) call.   Signature Lewanda Rife, LPN

## 2023-10-24 NOTE — Telephone Encounter (Signed)
 I am happy to see him - it looks like they are having trouble reaching him.

## 2023-10-24 NOTE — Transitions of Care (Post Inpatient/ED Visit) (Signed)
 Unable to reach patient by phone and left v/m requesting call back at [phone].  Pt has not been seen at [facility] since 03/07/2021.sending note to Dr Patsy Lager.         10/24/2023  Name: Zachary Simpson MRN: [id number] DOB: 1991/02/17  Today's TOC FU Call Status: Today's TOC FU Call Status:: Unsuccessful Call (2nd Attempt) Unsuccessful Call (1st Attempt) Date: 10/22/23 Unsuccessful Call (2nd Attempt) Date: 10/24/23  Attempted to reach the patient regarding the most recent Inpatient/ED visit.  Follow Up Plan: No further outreach attempts will be made at this time. We have been unable to contact the patient.  Signature Lewanda Rife, LPN

## 2023-10-27 NOTE — Telephone Encounter (Signed)
 I was able to reach pt and he has moved about 2 hrs away and lives near [place].pt is presently looking for new PCP near his home in [place] or [place]. Pt appreciates Dr Brayton Layman care. Sending FYI to Dr Patsy Lager and Copland  pool.

## 2024-02-10 NOTE — Progress Notes (Unsigned)
     Zachary Conradt T. Phillippe Orlick, MD, CAQ Sports Medicine [facility] at [facility] [address] [place], [address]  Phone: [phone]  FAX: [phone]  Zachary Simpson - 33 y.o. male  MRN [id number]  Date of Birth: [birth year]  Date: 02/11/2024  PCP: Zachary Mirza, MD  Referral: Zachary Mirza, MD  No chief complaint on file.  Virtual Visit via Video Note:  I connected with  Zachary Simpson on 02/11/2024 11:00 AM EDT by a video enabled telemedicine application and verified that I am speaking with the correct person using two identifiers.   Location patient: home computer, tablet, or smartphone Location provider: work or home office Consent: Verbal consent directly obtained from Zachary Simpson. Persons participating in the virtual visit: patient, provider  I discussed the limitations of evaluation and management by telemedicine and the availability of in person appointments. The patient expressed understanding and agreed to proceed.  No chief complaint on file.   History of Present Illness:  He is here to talk about problems sleeping and possible sleep apnea.    Review of Systems as above: See pertinent positives and pertinent negatives per HPI No acute distress verbally   Observations/Objective/Exam:  An attempt was made to discern vital signs over the phone and per patient if applicable and possible.   General:    Alert, Oriented, appears well and in no acute distress  Pulmonary:     On inspection no signs of respiratory distress.  Psych / Neurological:     Pleasant and cooperative.  Assessment and Plan:  No diagnosis found.   I discussed the assessment and treatment plan with the patient. The patient was provided an opportunity to ask questions and all were answered. The patient agreed with the plan and demonstrated an understanding of the instructions.   The patient was advised to call back or seek an in-person evaluation if the  symptoms worsen or if the condition fails to improve as anticipated.  Follow-up: prn unless noted otherwise below No follow-ups on file.  No orders of the defined types were placed in this encounter.  No orders of the defined types were placed in this encounter.   Signed,  Simpson DASEN. Daysi Boggan, MD

## 2024-02-11 ENCOUNTER — Encounter: Payer: Self-pay | Admitting: Family Medicine

## 2024-02-11 ENCOUNTER — Telehealth (INDEPENDENT_AMBULATORY_CARE_PROVIDER_SITE_OTHER): Payer: Self-pay | Admitting: Family Medicine

## 2024-02-11 VITALS — Ht 67.0 in

## 2024-02-11 DIAGNOSIS — N529 Male erectile dysfunction, unspecified: Secondary | ICD-10-CM

## 2024-02-11 DIAGNOSIS — R6882 Decreased libido: Secondary | ICD-10-CM

## 2024-02-11 DIAGNOSIS — G4733 Obstructive sleep apnea (adult) (pediatric): Secondary | ICD-10-CM

## 2024-03-17 ENCOUNTER — Telehealth: Payer: Self-pay | Admitting: Family Medicine

## 2024-03-17 NOTE — Telephone Encounter (Signed)
 I ordered a sleep medicine consult.  Consult sleep medicine physician for face-to-face consultation with the patient.   I did not order a direct sleep study.

## 2024-03-17 NOTE — Telephone Encounter (Signed)
 Referral/Last office note and demographics faxed to [phone] for sleep consult.

## 2024-03-17 NOTE — Telephone Encounter (Signed)
 Copied from CRM [id number]. Topic: Referral - Question >> Mar 16, 2024  3:50 PM Rea C wrote: Reason for CRM: Matt from [facility] stated that Dr. Watt did not not fill out the specific type of sleep study on the referral form for the patient. The form just said sleep medicine consult. Matt would appreciate confirmation on whether it is a sleep study or sleep consultation that he is recommending the past has.   Matt's number is [phone].   If the patient is needing a sleep consult, that is a different office and Adina provided the phone  [phone] and fax: [phone] for sleep consult.

## 2024-04-09 ENCOUNTER — Encounter: Payer: Self-pay | Admitting: Family Medicine
